# Patient Record
Sex: Male | Born: 1995 | Race: Black or African American | Hispanic: No | Marital: Single | Smoking: Never smoker
Health system: Southern US, Community
[De-identification: ages and names within clinical notes are randomized; demographics above are authoritative.]

## PROBLEM LIST (undated history)

## (undated) DIAGNOSIS — E119 Type 2 diabetes mellitus without complications: Secondary | ICD-10-CM

---

## 2016-10-19 DIAGNOSIS — E1110 Type 2 diabetes mellitus with ketoacidosis without coma: Secondary | ICD-10-CM | POA: Diagnosis present

## 2016-10-19 DIAGNOSIS — E119 Type 2 diabetes mellitus without complications: Secondary | ICD-10-CM | POA: Insufficient documentation

## 2017-01-19 DIAGNOSIS — I10 Essential (primary) hypertension: Secondary | ICD-10-CM | POA: Diagnosis present

## 2017-05-22 ENCOUNTER — Other Ambulatory Visit: Payer: Self-pay

## 2017-05-22 ENCOUNTER — Encounter (HOSPITAL_COMMUNITY): Payer: Self-pay | Admitting: Emergency Medicine

## 2017-05-22 ENCOUNTER — Emergency Department (HOSPITAL_COMMUNITY)
Admission: EM | Admit: 2017-05-22 | Discharge: 2017-05-23 | Disposition: A | Discharge status: Home / Self Care | Payer: Self-pay | Attending: Emergency Medicine | Admitting: Emergency Medicine

## 2017-05-22 ENCOUNTER — Emergency Department (HOSPITAL_COMMUNITY): Payer: Self-pay

## 2017-05-22 DIAGNOSIS — Y92410 Unspecified street and highway as the place of occurrence of the external cause: Secondary | ICD-10-CM | POA: Insufficient documentation

## 2017-05-22 DIAGNOSIS — E119 Type 2 diabetes mellitus without complications: Secondary | ICD-10-CM | POA: Insufficient documentation

## 2017-05-22 DIAGNOSIS — S43014A Anterior dislocation of right humerus, initial encounter: Secondary | ICD-10-CM | POA: Insufficient documentation

## 2017-05-22 DIAGNOSIS — Y999 Unspecified external cause status: Secondary | ICD-10-CM | POA: Insufficient documentation

## 2017-05-22 DIAGNOSIS — Y9389 Activity, other specified: Secondary | ICD-10-CM | POA: Insufficient documentation

## 2017-05-22 HISTORY — DX: Type 2 diabetes mellitus without complications: E11.9

## 2017-05-22 MED ORDER — HYDROMORPHONE HCL 1 MG/ML IJ SOLN
1.0000 mg | Freq: Once | INTRAMUSCULAR | Status: AC
  Administered 2017-05-22: 1 mg via INTRAVENOUS
  Filled 2017-05-22: qty 1

## 2017-05-22 MED ORDER — FENTANYL CITRATE (PF) 100 MCG/2ML IJ SOLN: 0.5000 ug/kg | Freq: Once | INTRAMUSCULAR | Status: DC

## 2017-05-22 MED ORDER — BUPIVACAINE HCL (PF) 0.5 % IJ SOLN
20.0000 mL | Freq: Once | INTRAMUSCULAR | Status: AC
  Administered 2017-05-22: 20 mL
  Filled 2017-05-22: qty 20

## 2017-05-22 MED ORDER — PROPOFOL 10 MG/ML IV BOLUS: 1.0000 mg/kg | Freq: Once | INTRAVENOUS | Status: DC
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
## 2017-05-22 NOTE — ED Notes (Signed)
Ortho paged. 

## 2017-05-22 NOTE — ED Notes (Signed)
Consent form for shoulder reduction signed

## 2017-05-23 ENCOUNTER — Emergency Department (HOSPITAL_COMMUNITY): Payer: Self-pay

## 2017-05-23 MED ORDER — CYCLOBENZAPRINE HCL 10 MG PO TABS: 10.0000 mg | ORAL_TABLET | Freq: Two times a day (BID) | ORAL | 0 refills | Status: DC | PRN

## 2017-05-23 MED ORDER — OXYCODONE-ACETAMINOPHEN 5-325 MG PO TABS: 1.0000 | ORAL_TABLET | ORAL | 0 refills | Status: DC | PRN
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
## 2017-05-23 NOTE — ED Notes (Signed)
Portable xray at bedside.

## 2017-05-23 NOTE — Progress Notes (Signed)
Orthopedic Tech Progress Note Patient Details:  Mark Contreras [birth year] [id number]  Ortho Devices Type of Ortho Device: Sling immobilizer Ortho Device/Splint Location: rue  Ortho Device/Splint Interventions: Ordered, Application   Post Interventions Patient Tolerated: Well Instructions Provided: Care of device, Adjustment of device Applied post reduction  [name], Cece Milhouse J 05/23/2017, 12:20 AM

## 2017-05-23 NOTE — ED Notes (Signed)
Shoulder reduction performed by Dr ward, ortho at bedside to apply shoulder immobilizer
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
## 2018-03-14 ENCOUNTER — Emergency Department (HOSPITAL_COMMUNITY)
Admission: EM | Admit: 2018-03-14 | Discharge: 2018-03-14 | Disposition: A | Discharge status: Home / Self Care | Payer: Medicaid Other | Attending: Emergency Medicine | Admitting: Emergency Medicine

## 2018-03-14 ENCOUNTER — Encounter (HOSPITAL_COMMUNITY): Payer: Self-pay

## 2018-03-14 DIAGNOSIS — Z79899 Other long term (current) drug therapy: Secondary | ICD-10-CM | POA: Insufficient documentation

## 2018-03-14 DIAGNOSIS — E119 Type 2 diabetes mellitus without complications: Secondary | ICD-10-CM | POA: Insufficient documentation

## 2018-03-14 DIAGNOSIS — J4521 Mild intermittent asthma with (acute) exacerbation: Secondary | ICD-10-CM | POA: Insufficient documentation

## 2018-03-14 MED ORDER — IPRATROPIUM-ALBUTEROL 0.5-2.5 (3) MG/3ML IN SOLN
3.0000 mL | Freq: Once | RESPIRATORY_TRACT | Status: AC
  Administered 2018-03-14: 3 mL via RESPIRATORY_TRACT
  Filled 2018-03-14: qty 3

## 2018-03-14 MED ORDER — ALBUTEROL SULFATE HFA 108 (90 BASE) MCG/ACT IN AERS: 1.0000 | INHALATION_SPRAY | Freq: Four times a day (QID) | RESPIRATORY_TRACT | 0 refills | Status: DC | PRN

## 2018-03-14 NOTE — Discharge Instructions (Addendum)
Please read attached information. If you experience any new or worsening signs or symptoms please return to the emergency room for evaluation. Please follow-up with your primary care provider or specialist as discussed. Please use medication prescribed only as directed and discontinue taking if you have any concerning signs or symptoms.   °

## 2018-03-14 NOTE — ED Provider Notes (Signed)
[facility] EMERGENCY DEPARTMENT Provider Note   CSN: [id number] Arrival date & time: 03/14/18  1209   History   Chief Complaint Chief Complaint  Patient presents with  . Cough    HPI Denzal Meir is a 22 y.o. male.  HPI  22 year old male presents today with complaints of asthma exacerbation.  Patient had symptoms started 2 days ago after being exposed to dust at work.  Patient notes wheeze that did not resolve with albuterol inhaler.  He denies any fever or productive cough, he notes some clear production with his cough with nasal congestion.  He notes this is typical of previous asthma exacerbations.  Past Medical History:  Diagnosis Date  . Diabetes mellitus without complication (HCC)     There are no active problems to display for this patient.   History reviewed. No pertinent surgical history.      Home Medications    Prior to Admission medications   Medication Sig Start Date End Date Taking? Authorizing Provider  albuterol (PROVENTIL HFA;VENTOLIN HFA) 108 (90 Base) MCG/ACT inhaler Inhale 1-2 puffs into the lungs every 6 (six) hours as needed for wheezing or shortness of breath. 03/14/18   Annarae Macnair, Tinnie Gens, PA-C  cyclobenzaprine (FLEXERIL) 10 MG tablet Take 1 tablet (10 mg total) by mouth 2 (two) times daily as needed for muscle spasms. 05/23/17   McDonald, Mia A, PA-C  oxyCODONE-acetaminophen (PERCOCET/ROXICET) 5-325 MG tablet Take 1 tablet by mouth every 4 (four) hours as needed for severe pain. 05/23/17   McDonald, Coral Else, PA-C    Family History History reviewed. No pertinent family history.  Social History Social History   Tobacco Use  . Smoking status: Never Smoker  . Smokeless tobacco: Never Used  Substance Use Topics  . Alcohol use: No    Frequency: Never  . Drug use: No     Allergies   Patient has no known allergies.   Review of Systems Review of Systems  All other systems reviewed and are negative.  Physical Exam Updated  Vital Signs BP (!) 144/97 (BP Location: Left Arm)   Pulse 84   Temp 98.4 F (36.9 C) (Oral)   Resp 16   Ht 5\' 7"  (1.702 m)   Wt 95.3 kg   SpO2 97%   BMI 32.89 kg/m   Physical Exam  Constitutional: He is oriented to person, place, and time. He appears well-developed and well-nourished.  HENT:  Head: Normocephalic and atraumatic.  Eyes: Pupils are equal, round, and reactive to light. Conjunctivae are normal. Right eye exhibits no discharge. Left eye exhibits no discharge. No scleral icterus.  Neck: Normal range of motion. No JVD present. No tracheal deviation present.  Pulmonary/Chest: Effort normal. No stridor.  Minor bilateral inspiratory and expiratory wheeze, no crackles no respiratory distress  Neurological: He is alert and oriented to person, place, and time. Coordination normal.  Psychiatric: He has a normal mood and affect. His behavior is normal. Judgment and thought content normal.  Nursing note and vitals reviewed.    ED Treatments / Results  Labs (all labs ordered are listed, but only abnormal results are displayed) Labs Reviewed - No data to display  EKG None  Radiology No results found.  Procedures Procedures (including critical care time)  Medications Ordered in ED Medications  ipratropium-albuterol (DUONEB) 0.5-2.5 (3) MG/3ML nebulizer solution 3 mL (3 mLs Nebulization Given 03/14/18 1228)     Initial Impression / Assessment and Plan / ED Course  I have reviewed the triage vital  signs and the nursing notes.  Pertinent labs & imaging results that were available during my care of the patient were reviewed by me and considered in my medical decision making (see chart for details).     Labs:   Imaging:  Consults:  Therapeutics: DuoNeb  Discharge Meds:   Assessment/Plan: 22 year old male presents today with likely asthma exacerbation.  This is minor, he will be given a breathing treatment and reassess with anticipated discharge home with  albuterol and return precautions.  Patient verbalized understanding and agreement to today's plan.   Final Clinical Impressions(s) / ED Diagnoses   Final diagnoses:  Mild intermittent asthma with exacerbation    ED Discharge Orders         Ordered    albuterol (PROVENTIL HFA;VENTOLIN HFA) 108 (90 Base) MCG/ACT inhaler  Every 6 hours PRN     03/14/18 1340           Eyvonne MechanicHedges, Eaton Folmar, PA-C 03/14/18 1341    Mancel BaleWentz, Elliott, MD 03/14/18 [phone]

## 2018-03-14 NOTE — ED Triage Notes (Signed)
Pt presents with 2 day h/o asthma exacerbation.  Pt reports dust at work that may have started cough, reports nasal congestion.
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
## 2021-02-13 ENCOUNTER — Other Ambulatory Visit: Payer: Self-pay

## 2021-02-13 ENCOUNTER — Encounter (HOSPITAL_COMMUNITY): Payer: Self-pay | Admitting: Emergency Medicine

## 2021-02-13 ENCOUNTER — Inpatient Hospital Stay (HOSPITAL_COMMUNITY)
Admission: EM | Admit: 2021-02-13 | Discharge: 2021-02-15 | DRG: 638 | Disposition: A | Discharge status: Home / Self Care | Payer: BC Managed Care – PPO | Attending: Student | Admitting: Student

## 2021-02-13 DIAGNOSIS — J4540 Moderate persistent asthma, uncomplicated: Secondary | ICD-10-CM | POA: Diagnosis present

## 2021-02-13 DIAGNOSIS — T383X6A Underdosing of insulin and oral hypoglycemic [antidiabetic] drugs, initial encounter: Secondary | ICD-10-CM | POA: Diagnosis present

## 2021-02-13 DIAGNOSIS — F1210 Cannabis abuse, uncomplicated: Secondary | ICD-10-CM | POA: Diagnosis present

## 2021-02-13 DIAGNOSIS — E875 Hyperkalemia: Secondary | ICD-10-CM | POA: Diagnosis present

## 2021-02-13 DIAGNOSIS — F1010 Alcohol abuse, uncomplicated: Secondary | ICD-10-CM | POA: Diagnosis not present

## 2021-02-13 DIAGNOSIS — R000 Tachycardia, unspecified: Secondary | ICD-10-CM

## 2021-02-13 DIAGNOSIS — E1010 Type 1 diabetes mellitus with ketoacidosis without coma: Secondary | ICD-10-CM | POA: Diagnosis not present

## 2021-02-13 DIAGNOSIS — E1110 Type 2 diabetes mellitus with ketoacidosis without coma: Secondary | ICD-10-CM

## 2021-02-13 DIAGNOSIS — Z20822 Contact with and (suspected) exposure to covid-19: Secondary | ICD-10-CM | POA: Diagnosis present

## 2021-02-13 DIAGNOSIS — F1290 Cannabis use, unspecified, uncomplicated: Secondary | ICD-10-CM | POA: Diagnosis present

## 2021-02-13 DIAGNOSIS — E1310 Other specified diabetes mellitus with ketoacidosis without coma: Secondary | ICD-10-CM

## 2021-02-13 DIAGNOSIS — Z79899 Other long term (current) drug therapy: Secondary | ICD-10-CM

## 2021-02-13 DIAGNOSIS — Z7141 Alcohol abuse counseling and surveillance of alcoholic: Secondary | ICD-10-CM

## 2021-02-13 DIAGNOSIS — E860 Dehydration: Secondary | ICD-10-CM | POA: Diagnosis present

## 2021-02-13 DIAGNOSIS — Z91199 Patient's noncompliance with other medical treatment and regimen due to unspecified reason: Secondary | ICD-10-CM

## 2021-02-13 DIAGNOSIS — E1165 Type 2 diabetes mellitus with hyperglycemia: Secondary | ICD-10-CM

## 2021-02-13 DIAGNOSIS — Z7151 Drug abuse counseling and surveillance of drug abuser: Secondary | ICD-10-CM

## 2021-02-13 DIAGNOSIS — N179 Acute kidney failure, unspecified: Secondary | ICD-10-CM | POA: Diagnosis not present

## 2021-02-13 DIAGNOSIS — E871 Hypo-osmolality and hyponatremia: Secondary | ICD-10-CM | POA: Diagnosis present

## 2021-02-13 DIAGNOSIS — E876 Hypokalemia: Secondary | ICD-10-CM | POA: Diagnosis not present

## 2021-02-13 DIAGNOSIS — E669 Obesity, unspecified: Secondary | ICD-10-CM | POA: Diagnosis present

## 2021-02-13 DIAGNOSIS — Z794 Long term (current) use of insulin: Secondary | ICD-10-CM

## 2021-02-13 DIAGNOSIS — Z9114 Patient's other noncompliance with medication regimen: Secondary | ICD-10-CM

## 2021-02-13 DIAGNOSIS — J4520 Mild intermittent asthma, uncomplicated: Secondary | ICD-10-CM | POA: Diagnosis present

## 2021-02-13 DIAGNOSIS — I10 Essential (primary) hypertension: Secondary | ICD-10-CM | POA: Diagnosis present

## 2021-02-13 LAB — I-STAT VENOUS BLOOD GAS, ED
Acid-base deficit: 15 mmol/L — ABNORMAL HIGH (ref 0.0–2.0)
Bicarbonate: 11.3 mmol/L — ABNORMAL LOW (ref 20.0–28.0)
Calcium, Ion: 1.2 mmol/L (ref 1.15–1.40)
HCT: 51 % (ref 39.0–52.0)
Hemoglobin: 17.3 g/dL — ABNORMAL HIGH (ref 13.0–17.0)
O2 Saturation: 60 %
Potassium: 6.1 mmol/L — ABNORMAL HIGH (ref 3.5–5.1)
Sodium: 135 mmol/L (ref 135–145)
TCO2: 12 mmol/L — ABNORMAL LOW (ref 22–32)
pCO2, Ven: 27.4 mmHg — ABNORMAL LOW (ref 44.0–60.0)
pH, Ven: 7.223 — ABNORMAL LOW (ref 7.250–7.430)
pO2, Ven: 37 mmHg (ref 32.0–45.0)

## 2021-02-13 LAB — BASIC METABOLIC PANEL
Anion gap: 28 — ABNORMAL HIGH (ref 5–15)
Anion gap: 24 — ABNORMAL HIGH (ref 5–15)
BUN: 28 mg/dL — ABNORMAL HIGH (ref 6–20)
BUN: 24 mg/dL — ABNORMAL HIGH (ref 6–20)
CO2: 12 mmol/L — ABNORMAL LOW (ref 22–32)
CO2: 12 mmol/L — ABNORMAL LOW (ref 22–32)
Calcium: 10.6 mg/dL — ABNORMAL HIGH (ref 8.9–10.3)
Calcium: 10.4 mg/dL — ABNORMAL HIGH (ref 8.9–10.3)
Chloride: 94 mmol/L — ABNORMAL LOW (ref 98–111)
Chloride: 104 mmol/L (ref 98–111)
Creatinine, Ser: 2.19 mg/dL — ABNORMAL HIGH (ref 0.61–1.24)
Creatinine, Ser: 2.04 mg/dL — ABNORMAL HIGH (ref 0.61–1.24)
GFR, Estimated: 42 mL/min — ABNORMAL LOW (ref >60)
GFR, Estimated: 45 mL/min — ABNORMAL LOW (ref >60)
Glucose, Bld: 666 mg/dL (ref 70–99)
Glucose, Bld: 250 mg/dL — ABNORMAL HIGH (ref 70–99)
Potassium: 7.4 mmol/L (ref 3.5–5.1)
Potassium: 4 mmol/L (ref 3.5–5.1)
Sodium: 134 mmol/L — ABNORMAL LOW (ref 135–145)
Sodium: 140 mmol/L (ref 135–145)

## 2021-02-13 LAB — URINALYSIS, ROUTINE W REFLEX MICROSCOPIC
Bacteria, UA: NONE SEEN
Bilirubin Urine: NEGATIVE
Glucose, UA: >=500 mg/dL — AB
Ketones, ur: 80 mg/dL — AB
Leukocytes,Ua: NEGATIVE
Nitrite: NEGATIVE
Protein, ur: 100 mg/dL — AB
Specific Gravity, Urine: 1.027 (ref 1.005–1.030)
pH: 5 (ref 5.0–8.0)

## 2021-02-13 LAB — CBC
HCT: 50 % (ref 39.0–52.0)
Hemoglobin: 16 g/dL (ref 13.0–17.0)
MCH: 26.1 pg (ref 26.0–34.0)
MCHC: 32 g/dL (ref 30.0–36.0)
MCV: 81.7 fL (ref 80.0–100.0)
Platelets: 217 10*3/uL (ref 150–400)
RBC: 6.12 MIL/uL — ABNORMAL HIGH (ref 4.22–5.81)
RDW: 12.6 % (ref 11.5–15.5)
WBC: 9 10*3/uL (ref 4.0–10.5)
nRBC: 0 % (ref 0.0–0.2)

## 2021-02-13 LAB — CBG MONITORING, ED
Glucose-Capillary: >600 mg/dL (ref 70–99)
Glucose-Capillary: 532 mg/dL (ref 70–99)
Glucose-Capillary: 294 mg/dL — ABNORMAL HIGH (ref 70–99)
Glucose-Capillary: 254 mg/dL — ABNORMAL HIGH (ref 70–99)
Glucose-Capillary: 239 mg/dL — ABNORMAL HIGH (ref 70–99)
Glucose-Capillary: 241 mg/dL — ABNORMAL HIGH (ref 70–99)
Glucose-Capillary: 247 mg/dL — ABNORMAL HIGH (ref 70–99)

## 2021-02-13 LAB — RAPID URINE DRUG SCREEN, HOSP PERFORMED
Amphetamines: NOT DETECTED
Barbiturates: NOT DETECTED
Benzodiazepines: NOT DETECTED
Cocaine: NOT DETECTED
Opiates: NOT DETECTED
Tetrahydrocannabinol: POSITIVE — AB

## 2021-02-13 LAB — RESP PANEL BY RT-PCR (FLU A&B, COVID) ARPGX2
Influenza A by PCR: NEGATIVE
Influenza B by PCR: NEGATIVE
SARS Coronavirus 2 by RT PCR: NEGATIVE

## 2021-02-13 LAB — BETA-HYDROXYBUTYRIC ACID
Beta-Hydroxybutyric Acid: >8 mmol/L — ABNORMAL HIGH (ref 0.05–0.27)
Beta-Hydroxybutyric Acid: >8 mmol/L — ABNORMAL HIGH (ref 0.05–0.27)

## 2021-02-13 LAB — HIV ANTIBODY (ROUTINE TESTING W REFLEX): HIV Screen 4th Generation wRfx: NONREACTIVE

## 2021-02-13 LAB — MAGNESIUM: Magnesium: 2.9 mg/dL — ABNORMAL HIGH (ref 1.7–2.4)

## 2021-02-13 LAB — PHOSPHORUS: Phosphorus: 5 mg/dL — ABNORMAL HIGH (ref 2.5–4.6)

## 2021-02-13 LAB — HEMOGLOBIN A1C
Hgb A1c MFr Bld: 10.9 % — ABNORMAL HIGH (ref 4.8–5.6)
Mean Plasma Glucose: 266.13 mg/dL

## 2021-02-13 MED ORDER — LACTATED RINGERS IV SOLN: INTRAVENOUS | Status: DC

## 2021-02-13 MED ORDER — LORAZEPAM 2 MG/ML IJ SOLN: 1.0000 mg | INTRAMUSCULAR | Status: DC | PRN

## 2021-02-13 MED ORDER — DEXTROSE 50 % IV SOLN: 0.0000 mL | INTRAVENOUS | Status: DC | PRN

## 2021-02-13 MED ORDER — FOLIC ACID 1 MG PO TABS
1.0000 mg | ORAL_TABLET | Freq: Every day | ORAL | Status: DC
  Administered 2021-02-13 – 2021-02-15 (×3): 1 mg via ORAL
  Filled 2021-02-13 (×3): qty 1

## 2021-02-13 MED ORDER — INSULIN REGULAR(HUMAN) IN NACL 100-0.9 UT/100ML-% IV SOLN
INTRAVENOUS | Status: DC
  Administered 2021-02-13: 13 [IU]/h via INTRAVENOUS
  Filled 2021-02-13: qty 100

## 2021-02-13 MED ORDER — INSULIN REGULAR(HUMAN) IN NACL 100-0.9 UT/100ML-% IV SOLN
INTRAVENOUS | Status: DC
  Administered 2021-02-13: 7.5 [IU]/h via INTRAVENOUS
  Administered 2021-02-14: 7 [IU]/h via INTRAVENOUS
  Administered 2021-02-14: 4.8 [IU]/h via INTRAVENOUS
  Administered 2021-02-14: 2.4 [IU]/h via INTRAVENOUS
  Administered 2021-02-14: 6 [IU]/h via INTRAVENOUS
  Administered 2021-02-14: 7 [IU]/h via INTRAVENOUS
  Administered 2021-02-14: 0.5 [IU]/h via INTRAVENOUS
  Filled 2021-02-13: qty 100

## 2021-02-13 MED ORDER — ALBUTEROL SULFATE (2.5 MG/3ML) 0.083% IN NEBU: 3.0000 mL | INHALATION_SOLUTION | RESPIRATORY_TRACT | Status: DC | PRN

## 2021-02-13 MED ORDER — LACTATED RINGERS IV BOLUS
1000.0000 mL | Freq: Once | INTRAVENOUS | Status: AC
  Administered 2021-02-13: 1000 mL via INTRAVENOUS

## 2021-02-13 MED ORDER — DEXTROSE IN LACTATED RINGERS 5 % IV SOLN: INTRAVENOUS | Status: DC

## 2021-02-13 MED ORDER — ADULT MULTIVITAMIN W/MINERALS CH
1.0000 | ORAL_TABLET | Freq: Every day | ORAL | Status: DC
  Administered 2021-02-13 – 2021-02-15 (×3): 1 via ORAL
  Filled 2021-02-13 (×3): qty 1

## 2021-02-13 MED ORDER — THIAMINE HCL 100 MG PO TABS
100.0000 mg | ORAL_TABLET | Freq: Every day | ORAL | Status: DC
  Administered 2021-02-13 – 2021-02-15 (×3): 100 mg via ORAL
  Filled 2021-02-13 (×3): qty 1

## 2021-02-13 MED ORDER — LORAZEPAM 1 MG PO TABS: 1.0000 mg | ORAL_TABLET | ORAL | Status: DC | PRN

## 2021-02-13 MED ORDER — LABETALOL HCL 5 MG/ML IV SOLN
10.0000 mg | INTRAVENOUS | Status: DC | PRN
  Administered 2021-02-13: 10 mg via INTRAVENOUS
  Filled 2021-02-13: qty 4

## 2021-02-13 MED ORDER — ENOXAPARIN SODIUM 60 MG/0.6ML IJ SOSY
50.0000 mg | PREFILLED_SYRINGE | INTRAMUSCULAR | Status: DC
  Administered 2021-02-13 – 2021-02-14 (×2): 50 mg via SUBCUTANEOUS
  Filled 2021-02-13: qty 0.5
  Filled 2021-02-13: qty 0.6

## 2021-02-13 MED ORDER — THIAMINE HCL 100 MG/ML IJ SOLN
100.0000 mg | Freq: Every day | INTRAMUSCULAR | Status: DC
  Filled 2021-02-13: qty 2

## 2021-02-13 MED ORDER — INSULIN ASPART 100 UNIT/ML IV SOLN
10.0000 [IU] | Freq: Once | INTRAVENOUS | Status: AC
  Administered 2021-02-13: 10 [IU] via INTRAVENOUS

## 2021-02-13 NOTE — H&P (Signed)
History and Physical    Aemon Koeller SUP:[id number] DOB: 1995-04-23 DOA: 02/13/2021  PCP: Pcp, No Patient coming from: Home  Chief Complaint: Frequent urination and weakness  HPI: Malakhai Beitler is a 25 y.o. male with history of IDDM, HTN, asthma, marijuana use and alcohol use disorder presenting with frequent urination for 3 days and generalized weakness for about 2 weeks.  Reportedly had runny nose about 2 weeks ago that has resolved.  He had generalized weakness for 2 weeks that he has been attributing to stress related to his work.  He works with [employer].  He he says he stopped his insulin and metformin about 3 years ago when his diabetes was under control although he was supposed to be on metformin as of March 2022 per care everywhere.  Not on medication for hypertension or asthma either.  He said he had his A1c checked earlier this year and was told that he does not need medication for his diabetes.  On review of his chart under care everywhere, his A1c was 6.1% on 06/10/2020.  [facility] for elevated blood pressure.  He denies fever, chills, polydipsia, dry mouth, chest pain, shortness of breath, cough, nausea, vomiting, abdominal pain or focal neurodeficit.  She reports frequent urination and associated dysuria.  He denies new back pain or suprapubic pain.   He reports smoking marijuana almost every day.  Drinks tequila.  Last drink was yesterday.    In ED, BP 145/105>> 178/107.  HR 127>> 112.  Afebrile.  Normal saturation.  VBG 7.2 06/15/35/11.  Na 134> 135. K 7.4>> 6.1. Cl 94.  Bicarb 12.  AG 29.  Glucose 666. Ca 10.6.  Cr 2.19.  BUN 28.  BHA > 8.0. UA > 500 glucose, small Hgb, 80 ketones and high troponin.  Twelve-lead EKG normal sinus rhythm.  Received 10 units of IV insulin and LR bolus.  Started on IV fluid and insulin drip, and hospitalist service consulted for admission for DKA.  ROS All review of system negative except for pertinent positives and negatives  as history of present illness above.  PMH Past Medical History:  Diagnosis Date   Diabetes mellitus without complication (HCC)    PSH History reviewed. No pertinent surgical history.  Fam HX Denies family history of heart disease  Social Hx See HPI  Allergy No Known Allergies to medications  Home Meds Prior to Admission medications   Medication Sig Start Date End Date Taking? Authorizing Provider  albuterol (PROVENTIL HFA;VENTOLIN HFA) 108 (90 Base) MCG/ACT inhaler Inhale 1-2 puffs into the lungs every 6 (six) hours as needed for wheezing or shortness of breath. 03/14/18   Hedges, Tinnie Gens, PA-C  cyclobenzaprine (FLEXERIL) 10 MG tablet Take 1 tablet (10 mg total) by mouth 2 (two) times daily as needed for muscle spasms. 05/23/17   McDonald, Mia A, PA-C  oxyCODONE-acetaminophen (PERCOCET/ROXICET) 5-325 MG tablet Take 1 tablet by mouth every 4 (four) hours as needed for severe pain. 05/23/17   Barkley Boards, PA-C    Physical Exam: Vitals:   02/13/21 1415 02/13/21 1445 02/13/21 1500 02/13/21 1515  BP: (!) 158/108 (!) 162/105 (!) 161/108 (!) 178/107  Pulse:  (!) 120    Resp: 17 (!) 21 16 18   Temp:      TempSrc:      SpO2:  98%    Weight:      Height:        GENERAL: No acute distress.  Appears well.  HEENT: MMM.  Vision and hearing grossly intact.  NECK: Supple.  No apparent JVD.  RESP:  No IWOB. Good air movement bilaterally. CVS: Tachycardic to 110s. Heart sounds normal.  ABD/GI/GU: Bowel sounds present. Soft. Non tender.  MSK/EXT:  Moves extremities. No apparent deformity or edema.  SKIN: no apparent skin lesion or wound NEURO: Awake, alert and oriented appropriately.  No gross deficit.  PSYCH: Calm. Normal affect.    Personally Reviewed Radiological Exams No results found.   Personally Reviewed Labs: CBC: Recent Labs  Lab 02/13/21 1111 02/13/21 1432  WBC 9.0  --   HGB 16.0 17.3*  HCT 50.0 51.0  MCV 81.7  --   PLT 217  --    Basic Metabolic  Panel: Recent Labs  Lab 02/13/21 1111 02/13/21 1432  NA 134* 135  K 7.4* 6.1*  CL 94*  --   CO2 12*  --   GLUCOSE 666*  --   BUN 28*  --   CREATININE 2.19*  --   CALCIUM 10.6*  --    GFR: Estimated Creatinine Clearance: 56.2 mL/min (A) (by C-G formula based on SCr of 2.19 mg/dL (H)). Liver Function Tests: No results for input(s): AST, ALT, ALKPHOS, BILITOT, PROT, ALBUMIN in the last 168 hours. No results for input(s): LIPASE, AMYLASE in the last 168 hours. No results for input(s): AMMONIA in the last 168 hours. Coagulation Profile: No results for input(s): INR, PROTIME in the last 168 hours. Cardiac Enzymes: No results for input(s): CKTOTAL, CKMB, CKMBINDEX, TROPONINI in the last 168 hours. BNP (last 3 results) No results for input(s): PROBNP in the last 8760 hours. HbA1C: No results for input(s): HGBA1C in the last 72 hours. CBG: Recent Labs  Lab 02/13/21 1101 02/13/21 1541  GLUCAP >600* 532*   Lipid Profile: No results for input(s): CHOL, HDL, LDLCALC, TRIG, CHOLHDL, LDLDIRECT in the last 72 hours. Thyroid Function Tests: No results for input(s): TSH, T4TOTAL, FREET4, T3FREE, THYROIDAB in the last 72 hours. Anemia Panel: No results for input(s): VITAMINB12, FOLATE, FERRITIN, TIBC, IRON, RETICCTPCT in the last 72 hours. Urine analysis:    Component Value Date/Time   COLORURINE COLORLESS (A) 02/13/2021 1106   APPEARANCEUR CLEAR 02/13/2021 1106   LABSPEC 1.027 02/13/2021 1106   PHURINE 5.0 02/13/2021 1106   GLUCOSEU >=500 (A) 02/13/2021 1106   HGBUR SMALL (A) 02/13/2021 1106   BILIRUBINUR NEGATIVE 02/13/2021 1106   KETONESUR 80 (A) 02/13/2021 1106   PROTEINUR 100 (A) 02/13/2021 1106   NITRITE NEGATIVE 02/13/2021 1106   LEUKOCYTESUR NEGATIVE 02/13/2021 1106    Sepsis Labs:  None  Personally Reviewed EKG:  Twelve-lead EKG with sinus tachycardia  Assessment/Plan Uncontrolled DM-2 with ketoacidosis/hyperglycemia: Likely due to noncompliance with medication  and alcohol abuse.  Glucose 666.  pH 7.2.  Bicarb 12.  AG 28.  BHA > 8.0.  K7.4>> 6.1.  No mental status change.  No signs of infection or ACS.  Dysuria and frequent urination likely from hyperglycemia. -DKA pathway with IV fluid, insulin drip and frequent labs -Check hemoglobin A1c -Consult diabetic coordinator -Okay with sips and ice chips  AKI/azotemia-likely prerenal in the setting of DKA. -IV fluid as above -Continue monitoring  Hyperkalemia: 7.4 but hemolyzed.  Repeat K6.1.  No EKG changes. -Continue IV fluid -Check with every 4 BMP  Hypercalcemia: Likely in the setting of #1. -IV fluids  Sinus tachycardia/uncontrolled hypertension: Supposed to be on lisinopril but not compliant. -As needed labetalol -Hold home lisinopril in the setting of AKI  Alcohol abuse:  Admits to drinking tequila.  Had 3 shots yesterday. -counseled -TOC consulted -CIWA with as needed Ativan -Multivitamin/thiamine and folic acid  Mild intermittent asthma: Not on medication. -As needed albuterol  Hyponatremia: Likely due to hyperglycemia.  Resolved.  DVT prophylaxis: Subcu Lovenox  Code Status: Full code Family Communication: Updated patient's significant other at bedside  Disposition Plan: Admit to progressive care Consults called: None Admission status: Observation  Level of care: Progressive   Almon Hercules MD [facility]  If 7PM-7AM, please contact night-coverage [email or web address]  02/13/2021, 4:30 PM

## 2021-02-13 NOTE — ED Provider Notes (Signed)
Emergency Medicine Provider Triage Evaluation Note  Mark Contreras , a 25 y.o. male  was evaluated in triage.  Pt complains of dehydration, polyuria, nausea, general malaise. Patient has been diagnosed with diabetes in the past, was taking metformin, currently not taking anything. Patient does not check blood sugar at home. No history of insulin use, DKA. No chest pain, SOB.  Review of Systems  Positive: As above Negative: As above  Physical Exam  BP (!) 145/105 (BP Location: Left Arm)   Pulse (!) 127   Temp 97.7 F (36.5 C) (Oral)   Resp 18   SpO2 99%  Gen:   Awake, ill appearing Resp:  Normal effort, somewhat shallow MSK:   Moves extremities without difficulty  Other:  Tachycardia, some TTP abdomen  Medical Decision Making  Medically screening exam initiated at 11:08 AM.  Appropriate orders placed.  Mark Contreras was informed that the remainder of the evaluation will be completed by another provider, this initial triage assessment does not replace that evaluation, and the importance of remaining in the ED until their evaluation is complete.  Hyperglycemic crisis, charge nurse informed patient needs room   [place] 02/13/21 1110    Tegeler, Canary Brim, MD 02/13/21 1131

## 2021-02-13 NOTE — ED Provider Notes (Signed)
[facility] EMERGENCY DEPARTMENT Provider Note   CSN: [id number] Arrival date & time: 02/13/21  1025     History Chief Complaint  Patient presents with   Hyperglycemia    Mark Contreras is a 25 y.o. male with PMH type 1 diabetes who presents the emergency department for evaluation of dehydration, polyuria and generalized malaise.  Patient states that in the past he was taking metformin but he is currently not taking any medications for his insulin.  He currently denies chest pain, shortness of breath, abdominal pain, fever, headache or other systemic symptoms.  Patient arrives tachycardic but overall very well-appearing.   Hyperglycemia Associated symptoms: fatigue, increased thirst and polyuria   Associated symptoms: no abdominal pain, no chest pain, no dysuria, no fever, no shortness of breath and no vomiting       Past Medical History:  Diagnosis Date   Diabetes mellitus without complication [facility])     Patient Active Problem List   Diagnosis Date Noted   DKA (diabetic ketoacidosis) (HCC) 02/13/2021    History reviewed. No pertinent surgical history.     No family history on file.  Social History   Tobacco Use   Smoking status: Never   Smokeless tobacco: Never  Substance Use Topics   Alcohol use: No   Drug use: No    Home Medications Prior to Admission medications   Medication Sig Start Date End Date Taking? Authorizing Provider  albuterol (PROVENTIL HFA;VENTOLIN HFA) 108 (90 Base) MCG/ACT inhaler Inhale 1-2 puffs into the lungs every 6 (six) hours as needed for wheezing or shortness of breath. 03/14/18   Hedges, Tinnie Gens, PA-C  cyclobenzaprine (FLEXERIL) 10 MG tablet Take 1 tablet (10 mg total) by mouth 2 (two) times daily as needed for muscle spasms. 05/23/17   McDonald, Mia A, PA-C  oxyCODONE-acetaminophen (PERCOCET/ROXICET) 5-325 MG tablet Take 1 tablet by mouth every 4 (four) hours as needed for severe pain. 05/23/17   McDonald, Mia A, PA-C     Allergies    Patient has no known allergies.  Review of Systems   Review of Systems  Constitutional:  Positive for fatigue. Negative for chills and fever.  HENT:  Negative for ear pain and sore throat.   Eyes:  Negative for pain and visual disturbance.  Respiratory:  Negative for cough and shortness of breath.   Cardiovascular:  Negative for chest pain and palpitations.  Gastrointestinal:  Negative for abdominal pain and vomiting.  Endocrine: Positive for polydipsia and polyuria.  Genitourinary:  Negative for dysuria and hematuria.  Musculoskeletal:  Negative for arthralgias and back pain.  Skin:  Negative for color change and rash.  Neurological:  Negative for seizures and syncope.  All other systems reviewed and are negative.  Physical Exam Updated Vital Signs BP (!) 178/107   Pulse (!) 120   Temp 97.7 F (36.5 C) (Oral)   Resp 18   Ht 5\' 7"  (1.702 m)   Wt 95.3 kg   SpO2 98%   BMI 32.89 kg/m   Physical Exam Vitals and nursing note reviewed.  Constitutional:      Appearance: He is well-developed.  HENT:     Head: Normocephalic and atraumatic.  Eyes:     Conjunctiva/sclera: Conjunctivae normal.  Cardiovascular:     Rate and Rhythm: Regular rhythm. Tachycardia present.     Heart sounds: No murmur heard. Pulmonary:     Effort: Pulmonary effort is normal. No respiratory distress.     Breath sounds: Normal breath sounds.  Abdominal:     Palpations: Abdomen is soft.     Tenderness: There is no abdominal tenderness.  Musculoskeletal:     Cervical back: Neck supple.  Skin:    General: Skin is warm and dry.  Neurological:     Mental Status: He is alert.    ED Results / Procedures / Treatments   Labs (all labs ordered are listed, but only abnormal results are displayed) Labs Reviewed  BASIC METABOLIC PANEL - Abnormal; Notable for the following components:      Result Value   Sodium 134 (*)    Potassium 7.4 (*)    Chloride 94 (*)    CO2 12 (*)    Glucose,  Bld 666 (*)    BUN 28 (*)    Creatinine, Ser 2.19 (*)    Calcium 10.6 (*)    GFR, Estimated 42 (*)    Anion gap 28 (*)    All other components within normal limits  CBC - Abnormal; Notable for the following components:   RBC 6.12 (*)    All other components within normal limits  URINALYSIS, ROUTINE W REFLEX MICROSCOPIC - Abnormal; Notable for the following components:   Color, Urine COLORLESS (*)    Glucose, UA >=500 (*)    Hgb urine dipstick SMALL (*)    Ketones, ur 80 (*)    Protein, ur 100 (*)    All other components within normal limits  BETA-HYDROXYBUTYRIC ACID - Abnormal; Notable for the following components:   Beta-Hydroxybutyric Acid >8.00 (*)    All other components within normal limits  CBG MONITORING, ED - Abnormal; Notable for the following components:   Glucose-Capillary >600 (*)    All other components within normal limits  CBG MONITORING, ED - Abnormal; Notable for the following components:   Glucose-Capillary 532 (*)    All other components within normal limits  I-STAT VENOUS BLOOD GAS, ED - Abnormal; Notable for the following components:   pH, Ven 7.223 (*)    pCO2, Ven 27.4 (*)    Bicarbonate 11.3 (*)    TCO2 12 (*)    Acid-base deficit 15.0 (*)    Potassium 6.1 (*)    Hemoglobin 17.3 (*)    All other components within normal limits  BETA-HYDROXYBUTYRIC ACID  BETA-HYDROXYBUTYRIC ACID  CBG MONITORING, ED    EKG None  Radiology No results found.  Procedures .Critical Care Performed by: Glendora Score, MD Authorized by: Glendora Score, MD   Critical care provider statement:    Critical care time (minutes):  50   Critical care was necessary to treat or prevent imminent or life-threatening deterioration of the following conditions:  Metabolic crisis   Critical care was time spent personally by me on the following activities:  Blood draw for specimens, ordering and performing treatments and interventions, ordering and review of laboratory studies,  ordering and review of radiographic studies, pulse oximetry, review of old charts and examination of patient   Medications Ordered in ED Medications  insulin regular, human (MYXREDLIN) 100 units/ 100 mL infusion (13 Units/hr Intravenous New Bag/Given 02/13/21 1615)  lactated ringers infusion ( Intravenous New Bag/Given 02/13/21 1618)  dextrose 5 % in lactated ringers infusion (0 mLs Intravenous Hold 02/13/21 1517)  dextrose 50 % solution 0-50 mL (has no administration in time range)  labetalol (NORMODYNE) injection 10 mg (has no administration in time range)  lactated ringers bolus 1,000 mL (1,000 mLs Intravenous New Bag/Given 02/13/21 1455)  insulin aspart (novoLOG) injection 10 Units (10  Units Intravenous Given 02/13/21 1605)    ED Course  I have reviewed the triage vital signs and the nursing notes.  Pertinent labs & imaging results that were available during my care of the patient were reviewed by me and considered in my medical decision making (see chart for details).    MDM Rules/Calculators/A&P                           Patient presents emergency department for evaluation of generalized malaise hyperglycemia, polyuria polydipsia.  Physical exam is large unremarkable.  Laboratory evaluation concerning with a pH of 7.2 potassium 7.4, CO2 12, glucose 666, creatinine 2.19 with a BUN of 28, beta hydroxybutyrate greater than 8, UA with ketonuria.  ECG with no QRS elongation or concerning findings in the setting of his hyperkalemia.  Patient started on an insulin drip and bolus 10 units for his hyperkalemia.  Fluid resuscitation began and patient admitted to stepdown unit for diabetic ketoacidosis. Final Clinical Impression(s) / ED Diagnoses Final diagnoses:  None    Rx / DC Orders ED Discharge Orders     None        Mohogany Toppins, Wyn Forster, MD 02/13/21 1624

## 2021-02-13 NOTE — ED Triage Notes (Signed)
Pt reports cotton mouth, frequent urination, and fatigue.  States he was diagnosed with diabetes in the past but never started on medication because his last blood work came back ok.

## 2021-02-14 DIAGNOSIS — Z79899 Other long term (current) drug therapy: Secondary | ICD-10-CM | POA: Diagnosis not present

## 2021-02-14 DIAGNOSIS — Z6832 Body mass index (BMI) 32.0-32.9, adult: Secondary | ICD-10-CM

## 2021-02-14 DIAGNOSIS — E1310 Other specified diabetes mellitus with ketoacidosis without coma: Secondary | ICD-10-CM | POA: Diagnosis not present

## 2021-02-14 DIAGNOSIS — E1010 Type 1 diabetes mellitus with ketoacidosis without coma: Secondary | ICD-10-CM | POA: Diagnosis present

## 2021-02-14 DIAGNOSIS — E669 Obesity, unspecified: Secondary | ICD-10-CM | POA: Diagnosis present

## 2021-02-14 DIAGNOSIS — Z91199 Patient's noncompliance with other medical treatment and regimen due to unspecified reason: Secondary | ICD-10-CM | POA: Diagnosis not present

## 2021-02-14 DIAGNOSIS — Z7141 Alcohol abuse counseling and surveillance of alcoholic: Secondary | ICD-10-CM | POA: Diagnosis not present

## 2021-02-14 DIAGNOSIS — T383X6A Underdosing of insulin and oral hypoglycemic [antidiabetic] drugs, initial encounter: Secondary | ICD-10-CM | POA: Diagnosis present

## 2021-02-14 DIAGNOSIS — E6609 Other obesity due to excess calories: Secondary | ICD-10-CM

## 2021-02-14 DIAGNOSIS — E860 Dehydration: Secondary | ICD-10-CM | POA: Diagnosis present

## 2021-02-14 DIAGNOSIS — N179 Acute kidney failure, unspecified: Secondary | ICD-10-CM | POA: Diagnosis present

## 2021-02-14 DIAGNOSIS — E871 Hypo-osmolality and hyponatremia: Secondary | ICD-10-CM | POA: Diagnosis present

## 2021-02-14 DIAGNOSIS — J4530 Mild persistent asthma, uncomplicated: Secondary | ICD-10-CM

## 2021-02-14 DIAGNOSIS — F1210 Cannabis abuse, uncomplicated: Secondary | ICD-10-CM | POA: Diagnosis not present

## 2021-02-14 DIAGNOSIS — E1110 Type 2 diabetes mellitus with ketoacidosis without coma: Secondary | ICD-10-CM | POA: Diagnosis present

## 2021-02-14 DIAGNOSIS — Z9114 Patient's other noncompliance with medication regimen: Secondary | ICD-10-CM | POA: Diagnosis not present

## 2021-02-14 DIAGNOSIS — F1010 Alcohol abuse, uncomplicated: Secondary | ICD-10-CM | POA: Diagnosis present

## 2021-02-14 DIAGNOSIS — F1290 Cannabis use, unspecified, uncomplicated: Secondary | ICD-10-CM | POA: Diagnosis present

## 2021-02-14 DIAGNOSIS — Z794 Long term (current) use of insulin: Secondary | ICD-10-CM | POA: Diagnosis not present

## 2021-02-14 DIAGNOSIS — E876 Hypokalemia: Secondary | ICD-10-CM | POA: Diagnosis not present

## 2021-02-14 DIAGNOSIS — Z20822 Contact with and (suspected) exposure to covid-19: Secondary | ICD-10-CM | POA: Diagnosis present

## 2021-02-14 DIAGNOSIS — J4520 Mild intermittent asthma, uncomplicated: Secondary | ICD-10-CM | POA: Diagnosis present

## 2021-02-14 DIAGNOSIS — E875 Hyperkalemia: Secondary | ICD-10-CM | POA: Diagnosis present

## 2021-02-14 DIAGNOSIS — I10 Essential (primary) hypertension: Secondary | ICD-10-CM | POA: Diagnosis present

## 2021-02-14 DIAGNOSIS — Z7151 Drug abuse counseling and surveillance of drug abuser: Secondary | ICD-10-CM | POA: Diagnosis not present

## 2021-02-14 LAB — RENAL FUNCTION PANEL
Albumin: 3.7 g/dL (ref 3.5–5.0)
Albumin: 3.3 g/dL — ABNORMAL LOW (ref 3.5–5.0)
Albumin: 3.4 g/dL — ABNORMAL LOW (ref 3.5–5.0)
Anion gap: 11 (ref 5–15)
Anion gap: 9 (ref 5–15)
Anion gap: 11 (ref 5–15)
BUN: 21 mg/dL — ABNORMAL HIGH (ref 6–20)
BUN: 18 mg/dL (ref 6–20)
BUN: 20 mg/dL (ref 6–20)
CO2: 22 mmol/L (ref 22–32)
CO2: 22 mmol/L (ref 22–32)
CO2: 21 mmol/L — ABNORMAL LOW (ref 22–32)
Calcium: 9.5 mg/dL (ref 8.9–10.3)
Calcium: 9.1 mg/dL (ref 8.9–10.3)
Calcium: 9.1 mg/dL (ref 8.9–10.3)
Chloride: 107 mmol/L (ref 98–111)
Chloride: 106 mmol/L (ref 98–111)
Chloride: 105 mmol/L (ref 98–111)
Creatinine, Ser: 1.35 mg/dL — ABNORMAL HIGH (ref 0.61–1.24)
Creatinine, Ser: 1.36 mg/dL — ABNORMAL HIGH (ref 0.61–1.24)
Creatinine, Ser: 1.32 mg/dL — ABNORMAL HIGH (ref 0.61–1.24)
GFR, Estimated: >60 mL/min (ref >60)
GFR, Estimated: >60 mL/min (ref >60)
GFR, Estimated: >60 mL/min (ref >60)
Glucose, Bld: 220 mg/dL — ABNORMAL HIGH (ref 70–99)
Glucose, Bld: 326 mg/dL — ABNORMAL HIGH (ref 70–99)
Glucose, Bld: 319 mg/dL — ABNORMAL HIGH (ref 70–99)
Phosphorus: 2.1 mg/dL — ABNORMAL LOW (ref 2.5–4.6)
Phosphorus: 1.8 mg/dL — ABNORMAL LOW (ref 2.5–4.6)
Phosphorus: 1.8 mg/dL — ABNORMAL LOW (ref 2.5–4.6)
Potassium: 3.4 mmol/L — ABNORMAL LOW (ref 3.5–5.1)
Potassium: 3.2 mmol/L — ABNORMAL LOW (ref 3.5–5.1)
Potassium: 3.4 mmol/L — ABNORMAL LOW (ref 3.5–5.1)
Sodium: 140 mmol/L (ref 135–145)
Sodium: 137 mmol/L (ref 135–145)
Sodium: 137 mmol/L (ref 135–145)

## 2021-02-14 LAB — GLUCOSE, CAPILLARY
Glucose-Capillary: 193 mg/dL — ABNORMAL HIGH (ref 70–99)
Glucose-Capillary: 224 mg/dL — ABNORMAL HIGH (ref 70–99)
Glucose-Capillary: 236 mg/dL — ABNORMAL HIGH (ref 70–99)
Glucose-Capillary: 217 mg/dL — ABNORMAL HIGH (ref 70–99)
Glucose-Capillary: 131 mg/dL — ABNORMAL HIGH (ref 70–99)
Glucose-Capillary: 265 mg/dL — ABNORMAL HIGH (ref 70–99)
Glucose-Capillary: 348 mg/dL — ABNORMAL HIGH (ref 70–99)
Glucose-Capillary: 302 mg/dL — ABNORMAL HIGH (ref 70–99)
Glucose-Capillary: 375 mg/dL — ABNORMAL HIGH (ref 70–99)

## 2021-02-14 LAB — BASIC METABOLIC PANEL
Anion gap: 14 (ref 5–15)
Anion gap: 15 (ref 5–15)
Anion gap: 13 (ref 5–15)
BUN: 21 mg/dL — ABNORMAL HIGH (ref 6–20)
BUN: 22 mg/dL — ABNORMAL HIGH (ref 6–20)
BUN: 23 mg/dL — ABNORMAL HIGH (ref 6–20)
CO2: 20 mmol/L — ABNORMAL LOW (ref 22–32)
CO2: 20 mmol/L — ABNORMAL LOW (ref 22–32)
CO2: 19 mmol/L — ABNORMAL LOW (ref 22–32)
Calcium: 10 mg/dL (ref 8.9–10.3)
Calcium: 10.1 mg/dL (ref 8.9–10.3)
Calcium: 9.9 mg/dL (ref 8.9–10.3)
Chloride: 105 mmol/L (ref 98–111)
Chloride: 105 mmol/L (ref 98–111)
Chloride: 104 mmol/L (ref 98–111)
Creatinine, Ser: 1.8 mg/dL — ABNORMAL HIGH (ref 0.61–1.24)
Creatinine, Ser: 1.83 mg/dL — ABNORMAL HIGH (ref 0.61–1.24)
Creatinine, Ser: 1.57 mg/dL — ABNORMAL HIGH (ref 0.61–1.24)
GFR, Estimated: 52 mL/min — ABNORMAL LOW (ref >60)
GFR, Estimated: 53 mL/min — ABNORMAL LOW (ref >60)
GFR, Estimated: >60 mL/min (ref >60)
Glucose, Bld: 238 mg/dL — ABNORMAL HIGH (ref 70–99)
Glucose, Bld: 238 mg/dL — ABNORMAL HIGH (ref 70–99)
Glucose, Bld: 208 mg/dL — ABNORMAL HIGH (ref 70–99)
Potassium: 4.3 mmol/L (ref 3.5–5.1)
Potassium: 5.3 mmol/L — ABNORMAL HIGH (ref 3.5–5.1)
Potassium: 5.2 mmol/L — ABNORMAL HIGH (ref 3.5–5.1)
Sodium: 139 mmol/L (ref 135–145)
Sodium: 140 mmol/L (ref 135–145)
Sodium: 136 mmol/L (ref 135–145)

## 2021-02-14 LAB — CBG MONITORING, ED
Glucose-Capillary: 182 mg/dL — ABNORMAL HIGH (ref 70–99)
Glucose-Capillary: 193 mg/dL — ABNORMAL HIGH (ref 70–99)
Glucose-Capillary: 206 mg/dL — ABNORMAL HIGH (ref 70–99)
Glucose-Capillary: 213 mg/dL — ABNORMAL HIGH (ref 70–99)
Glucose-Capillary: 223 mg/dL — ABNORMAL HIGH (ref 70–99)
Glucose-Capillary: 227 mg/dL — ABNORMAL HIGH (ref 70–99)
Glucose-Capillary: 256 mg/dL — ABNORMAL HIGH (ref 70–99)
Glucose-Capillary: 257 mg/dL — ABNORMAL HIGH (ref 70–99)

## 2021-02-14 LAB — BETA-HYDROXYBUTYRIC ACID
Beta-Hydroxybutyric Acid: 5.46 mmol/L — ABNORMAL HIGH (ref 0.05–0.27)
Beta-Hydroxybutyric Acid: 1.74 mmol/L — ABNORMAL HIGH (ref 0.05–0.27)

## 2021-02-14 LAB — PHOSPHORUS
Phosphorus: 3 mg/dL (ref 2.5–4.6)
Phosphorus: 3.3 mg/dL (ref 2.5–4.6)

## 2021-02-14 LAB — MAGNESIUM
Magnesium: 2.6 mg/dL — ABNORMAL HIGH (ref 1.7–2.4)
Magnesium: 2.4 mg/dL (ref 1.7–2.4)
Magnesium: 1.8 mg/dL (ref 1.7–2.4)

## 2021-02-14 MED ORDER — INSULIN ASPART 100 UNIT/ML IJ SOLN: 2.0000 [IU] | Freq: Three times a day (TID) | INTRAMUSCULAR | Status: DC

## 2021-02-14 MED ORDER — INSULIN GLARGINE-YFGN 100 UNIT/ML ~~LOC~~ SOLN
15.0000 [IU] | SUBCUTANEOUS | Status: DC
  Administered 2021-02-14: 15 [IU] via SUBCUTANEOUS
  Filled 2021-02-14 (×2): qty 0.15

## 2021-02-14 MED ORDER — POTASSIUM PHOSPHATES 15 MMOLE/5ML IV SOLN
30.0000 mmol | Freq: Once | INTRAVENOUS | Status: AC
  Administered 2021-02-14: 30 mmol via INTRAVENOUS
  Filled 2021-02-14: qty 10

## 2021-02-14 MED ORDER — INSULIN ASPART 100 UNIT/ML IJ SOLN
6.0000 [IU] | Freq: Three times a day (TID) | INTRAMUSCULAR | Status: DC
  Administered 2021-02-14 – 2021-02-15 (×3): 6 [IU] via SUBCUTANEOUS

## 2021-02-14 MED ORDER — POTASSIUM CHLORIDE CRYS ER 20 MEQ PO TBCR
40.0000 meq | EXTENDED_RELEASE_TABLET | ORAL | Status: AC
Start: 2021-02-14 — End: 2021-02-14
  Administered 2021-02-14: 40 meq via ORAL
  Filled 2021-02-14: qty 2

## 2021-02-14 MED ORDER — INSULIN ASPART 100 UNIT/ML IJ SOLN
0.0000 [IU] | Freq: Every day | INTRAMUSCULAR | Status: DC
  Administered 2021-02-14: 5 [IU] via SUBCUTANEOUS

## 2021-02-14 MED ORDER — INSULIN ASPART 100 UNIT/ML IJ SOLN
0.0000 [IU] | Freq: Three times a day (TID) | INTRAMUSCULAR | Status: DC
Start: 2021-02-14 — End: 2021-02-16
  Administered 2021-02-14: 11 [IU] via SUBCUTANEOUS
  Administered 2021-02-15: 5 [IU] via SUBCUTANEOUS
  Administered 2021-02-15: 11 [IU] via SUBCUTANEOUS

## 2021-02-14 MED ORDER — LACTATED RINGERS IV SOLN: INTRAVENOUS | Status: DC

## 2021-02-14 NOTE — Progress Notes (Signed)
1800: Patient has eaten and received his long acting insulin dose about 2 hours ago. Insulin pump stopped, Blood sugar checked and it's 302, 17units of short acting insulin given per order.

## 2021-02-14 NOTE — Progress Notes (Signed)
Inpatient Diabetes Program Recommendations  AACE/ADA: New Consensus Statement on Inpatient Glycemic Control   Target Ranges:  Prepandial:   less than 140 mg/dL      Peak postprandial:   less than 180 mg/dL (1-2 hours)      Critically ill patients:  140 - 180 mg/dL   Results for Mark Contreras, Mark Contreras (MRN [id number]) as of 02/14/2021 10:36  Ref. Range 02/14/2021 00:02 02/14/2021 01:20 02/14/2021 02:16 02/14/2021 03:54 02/14/2021 04:55 02/14/2021 05:54 02/14/2021 07:17 02/14/2021 08:19 02/14/2021 09:41  Glucose-Capillary Latest Ref Range: 70 - 99 mg/dL 160 (H) 109 (H) 323 (H) 193 (H) 182 (H) 213 (H) 223 (H) 227 (H) 193 (H)   Results for Mark Contreras, Mark Contreras (MRN [id number]) as of 02/14/2021 10:36  Ref. Range 02/13/2021 11:11  CO2 Latest Ref Range: 22 - 32 mmol/L 12 (L)  Glucose Latest Ref Range: 70 - 99 mg/dL 427 (HH)  Anion gap Latest Ref Range: 5 - 15  28 (H)  Results for Mark Contreras, Mark Contreras (MRN [id number]) as of 02/14/2021 10:36  Ref. Range 02/13/2021 11:11 02/13/2021 19:07 02/14/2021 01:28  Beta-Hydroxybutyric Acid Latest Ref Range: 0.05 - 0.27 mmol/L >8.00 (H) >8.00 (H) 5.46 (H)   Review of Glycemic Control  Diabetes history: DM2 Outpatient Diabetes medications: None; had been on Metformin XR, Lantus, and Novolog in the past (per chart review) Current orders for Inpatient glycemic control: IV insulin per DKA  Inpatient Diabetes Program Recommendations:    Insulin: IV insulin should be continued until acidosis has been completely resolved. Once acidosis is resolved and provider is ready to transition from IV to SQ insulin, please consider ordering Semglee 25 units (based on 95.3 kg x 0.25 units), CBGs Q4H, Novolog 0-15 units Q4H.  NOTE: Noted consult for Diabetes Coordinator. Diabetes Coordinator is not on campus over the weekend but available by pager from 8am to 5pm for questions or concerns. Chart reviewed. Patient admitted with DKA with initial glucose of 666 mg/dl on 15/17/61 and patient was  started on IV insulin drip. Noted initial consult note on 01/19/2017 by Dr. Leslie Dales (Endocrinologist) which notes "Diabetes was diagnosed on 10/19/16 when he presented to urgent care with 1 week history of "loss of energy, exhausted, increased thirst and frequent urination every 20 minutes." He had been drinking moderately high amounts of apple juice and ginger ale and eating grapes. Weight was down 25# (to 198#). Glucose was 1222. He was hospitalized in [place], [place] for 3 days, diagnosed as type 1 diabetes, and discharged with sugars about 250 on metformin and insulin. He saw Samuel Bouche, NP [facility] [place] [place]) on 10/26/16, at which time lab glucose was 107 and HbA1c was 11.6%. Metformin and insulin were continued. Likely a variant type 2 (rather than type 1) diabetes, with severe hyperglycemia at diagnosis fueled by short-term intake of large quantities of sugar sweetened beverages; then rapid glycemic improvement and stabilization after diagnosis in 7/18; no significant change in glycemic control by his report off metformin for 2 weeks and Novolog for 10 days; C-Peptide 2.39 and GAD-65 autoantibody negative in 9/18. Patient was advised to Resume metformin, as metformin ER (extended release) 500 mg 2 tablets once a day. Stop Lantus. Remain off Novolog."  Diabetes coordinator will plan to follow up with patient on Monday 02/15/21.  Thanks, Orlando Penner, RN, MSN, CDE Diabetes Coordinator Inpatient Diabetes Program [phone] (Team Pager from 8am to 5pm)

## 2021-02-14 NOTE — Progress Notes (Signed)
PROGRESS NOTE  Mark Contreras KDX:[id number] DOB: 06/06/1995   PCP: Pcp, No  Patient is from: Home.  DOA: 02/13/2021 LOS: 0  Chief complaints:  Chief Complaint  Patient presents with   Hyperglycemia     Brief Narrative / Interim history: 25 y.o. male with history of IDDM, HTN, asthma, marijuana use and alcohol use disorder presenting with frequent urination for 3 days and generalized weakness for about 2 weeks, and admitted with DKA, hyperkalemia, AKI, uncontrolled hypertension and tachycardia.  Reportedly has been diabetic meds for 3 years, not on antihypertensive meds or inhalers although he was supposed to be on metformin and lisinopril prior his chart from care everywhere in March 2022.  Subjective: Seen and examined earlier this morning.  No major events overnight of this morning.  He reports dry mouth, frequency and some dysuria.  He attributed the dysuria to urinating frequently.  No fever, chest pain, dyspnea or GI symptoms.  Objective: Vitals:   02/14/21 0745 02/14/21 0830 02/14/21 0900 02/14/21 0947  BP: 133/77 (!) 144/98 (!) 139/98 (!) 143/90  Pulse: 89 97 81   Resp: 14 12 10    Temp:      TempSrc:      SpO2: 95% 96% 95% 96%  Weight:      Height:        Intake/Output Summary (Last 24 hours) at 02/14/2021 1025 Last data filed at 02/14/2021 0347 Gross per 24 hour  Intake 214.43 ml  Output 500 ml  Net -285.57 ml   Filed Weights   02/13/21 1312  Weight: 95.3 kg    Examination:  GENERAL: No apparent distress.  Nontoxic. HEENT: MMM.  Vision and hearing grossly intact.  NECK: Supple.  No apparent JVD.  RESP:  No IWOB.  Fair aeration bilaterally. CVS:  RRR. Heart sounds normal.  ABD/GI/GU: BS+. Abd soft, NTND.  MSK/EXT:  Moves extremities. No apparent deformity. No edema.  SKIN: no apparent skin lesion or wound NEURO: Awake, alert and oriented appropriately.  No apparent focal neuro deficit. PSYCH: Calm. Normal affect.   Procedures:  None  Microbiology  summarized: COVID-19 and influenza PCR nonreactive.  Assessment & Plan:= Uncontrolled DM-2 with ketoacidosis/hyperglycemia: Likely due to noncompliance and alcohol abuse.  Glucose 666.  pH 7.2.  Bicarb 12>> 19.  AG 28>>> 13.  BHA > 8.0>> 5.46.  K7.4>> 6.1> 5.2.  No mental status change.  No signs of infection or ACS.  Dysuria and frequent urination likely from hyperglycemia.  A1c 10.9% (from 6.1 in 05/2020). Still with acidosis and hyperkalemia. -Continue DKA pathway with IV fluid, insulin drip and frequent labs, and transition when appropriate -Increase IV LR to 150 cc an hour -Diabetic coordinator consulted. -N.p.o. except sips and ice chips   AKI/azotemia-likely prerenal in the setting of DKA.  Improved. Recent Labs    02/13/21 1111 02/13/21 1909 02/14/21 0128 02/14/21 0742  BUN 28* 24* 21*  22* 23*  CREATININE 2.19* 2.04* 1.80*  1.83* 1.57*  -IV fluid as above -Continue monitoring   Hyperkalemia: 7.4 but hemolyzed> 6.1> 5.2.  Likely intracellular shift from acidosis Hypercalcemia: 10.6>> 9.9.  Likely due to dehydration in the setting of DKA. Hyperphosphatemia: P 5.0>> 3.3.  Resolved. -Continue IV fluid -Check with every 4 BMP   Sinus tachycardia/uncontrolled hypertension: Likely due to DKA.  Tachycardia resolved.  BP improved. -Continue labetalol as needed -Continue holding home lisinopril in the setting of AKI   Alcohol abuse: Admits to drinking tequila.  Had 3 shots yesterday.  No withdrawal symptoms. -Counseled  and TOC consulted. -Continue CIWA with as needed Ativan -Multivitamin/thiamine and folic acid   Mild intermittent asthma: Not on medication. -As needed albuterol   Hyponatremia: Likely due to hyperglycemia.  Resolved.  Marijuana use:  -Encourage cessation.  Class I obesity Body mass index is 32.89 kg/m.  -Encourage lifestyle change to lose weight.       DVT prophylaxis:  On subcu Lovenox  Code Status: Full code Family Communication: Patient  and/or RN. Available if any question.  Level of care: Progressive Status is: Observation  The patient will require care spanning > 2 midnights and should be moved to inpatient because: Diabetic ketoacidosis requiring IV fluid and IV insulin, electrolytes abnormalities including hyperkalemia and AKI      Consultants:  Diabetic coordinator  Sch Meds:  Scheduled Meds:  enoxaparin (LOVENOX) injection  50 mg Subcutaneous Q24H   folic acid  1 mg Oral Daily   multivitamin with minerals  1 tablet Oral Daily   thiamine  100 mg Oral Daily   Or   thiamine  100 mg Intravenous Daily   Continuous Infusions:  dextrose 5% lactated ringers 125 mL/hr at 02/14/21 0553   insulin 3.2 Units/hr (02/14/21 0956)   lactated ringers Stopped (02/14/21 0310)   PRN Meds:.albuterol, dextrose, labetalol, LORazepam **OR** LORazepam  Antimicrobials: Anti-infectives (From admission, onward)    None        I have personally reviewed the following labs and images: CBC: Recent Labs  Lab 02/13/21 1111 02/13/21 1432  WBC 9.0  --   HGB 16.0 17.3*  HCT 50.0 51.0  MCV 81.7  --   PLT 217  --    BMP &GFR Recent Labs  Lab 02/13/21 1111 02/13/21 1432 02/13/21 1909 02/14/21 0128 02/14/21 0742  NA 134* 135 140 139  140 136  K 7.4* 6.1* 4.0 5.3*  4.3 5.2*  CL 94*  --  104 105  105 104  CO2 12*  --  12* 20*  20* 19*  GLUCOSE 666*  --  250* 238*  238* 208*  BUN 28*  --  24* 21*  22* 23*  CREATININE 2.19*  --  2.04* 1.80*  1.83* 1.57*  CALCIUM 10.6*  --  10.4* 10.0  10.1 9.9  MG  --   --  2.9* 2.6* 2.4  PHOS  --   --  5.0* 3.0 3.3   Estimated Creatinine Clearance: 78.5 mL/min (A) (by C-G formula based on SCr of 1.57 mg/dL (H)). Liver & Pancreas: No results for input(s): AST, ALT, ALKPHOS, BILITOT, PROT, ALBUMIN in the last 168 hours. No results for input(s): LIPASE, AMYLASE in the last 168 hours. No results for input(s): AMMONIA in the last 168 hours. Diabetic: Recent Labs     02/13/21 1815  HGBA1C 10.9*   Recent Labs  Lab 02/14/21 0455 02/14/21 0554 02/14/21 0717 02/14/21 0819 02/14/21 0941  GLUCAP 182* 213* 223* 227* 193*   Cardiac Enzymes: No results for input(s): CKTOTAL, CKMB, CKMBINDEX, TROPONINI in the last 168 hours. No results for input(s): PROBNP in the last 8760 hours. Coagulation Profile: No results for input(s): INR, PROTIME in the last 168 hours. Thyroid Function Tests: No results for input(s): TSH, T4TOTAL, FREET4, T3FREE, THYROIDAB in the last 72 hours. Lipid Profile: No results for input(s): CHOL, HDL, LDLCALC, TRIG, CHOLHDL, LDLDIRECT in the last 72 hours. Anemia Panel: No results for input(s): VITAMINB12, FOLATE, FERRITIN, TIBC, IRON, RETICCTPCT in the last 72 hours. Urine analysis:    Component Value Date/Time   COLORURINE  COLORLESS (A) 02/13/2021 1106   APPEARANCEUR CLEAR 02/13/2021 1106   LABSPEC 1.027 02/13/2021 1106   PHURINE 5.0 02/13/2021 1106   GLUCOSEU >=500 (A) 02/13/2021 1106   HGBUR SMALL (A) 02/13/2021 1106   BILIRUBINUR NEGATIVE 02/13/2021 1106   KETONESUR 80 (A) 02/13/2021 1106   PROTEINUR 100 (A) 02/13/2021 1106   NITRITE NEGATIVE 02/13/2021 1106   LEUKOCYTESUR NEGATIVE 02/13/2021 1106   Sepsis Labs: Invalid input(s): PROCALCITONIN, LACTICIDVEN  Microbiology: Recent Results (from the past 240 hour(s))  Resp Panel by RT-PCR (Flu A&B, Covid) Nasopharyngeal Swab     Status: None   Collection Time: 02/13/21  7:09 PM   Specimen: Nasopharyngeal Swab; Nasopharyngeal(NP) swabs in vial transport medium  Result Value Ref Range Status   SARS Coronavirus 2 by RT PCR NEGATIVE NEGATIVE Final    Comment: (NOTE) SARS-CoV-2 target nucleic acids are NOT DETECTED.  The SARS-CoV-2 RNA is generally detectable in upper respiratory specimens during the acute phase of infection. The lowest concentration of SARS-CoV-2 viral copies this assay can detect is 138 copies/mL. A negative result does not preclude  SARS-Cov-2 infection and should not be used as the sole basis for treatment or other patient management decisions. A negative result may occur with  improper specimen collection/handling, submission of specimen other than nasopharyngeal swab, presence of viral mutation(s) within the areas targeted by this assay, and inadequate number of viral copies(<138 copies/mL). A negative result must be combined with clinical observations, patient history, and epidemiological information. The expected result is Negative.  Fact Sheet for Patients:  BloggerCourse.com  Fact Sheet for Healthcare Providers:  SeriousBroker.it  This test is no t yet approved or cleared by the [place] FDA and  has been authorized for detection and/or diagnosis of SARS-CoV-2 by FDA under an Emergency Use Authorization (EUA). This EUA will remain  in effect (meaning this test can be used) for the duration of the COVID-19 declaration under Section 564(b)(1) of the Act, 21 U.S.C.section 360bbb-3(b)(1), unless the authorization is terminated  or revoked sooner.       Influenza A by PCR NEGATIVE NEGATIVE Final   Influenza B by PCR NEGATIVE NEGATIVE Final    Comment: (NOTE) The Xpert Xpress SARS-CoV-2/FLU/RSV plus assay is intended as an aid in the diagnosis of influenza from Nasopharyngeal swab specimens and should not be used as a sole basis for treatment. Nasal washings and aspirates are unacceptable for Xpert Xpress SARS-CoV-2/FLU/RSV testing.  Fact Sheet for Patients: BloggerCourse.com  Fact Sheet for Healthcare Providers: SeriousBroker.it  This test is not yet approved or cleared by the [place] FDA and has been authorized for detection and/or diagnosis of SARS-CoV-2 by FDA under an Emergency Use Authorization (EUA). This EUA will remain in effect (meaning this test can be used) for the duration of  the COVID-19 declaration under Section 564(b)(1) of the Act, 21 U.S.C. section 360bbb-3(b)(1), unless the authorization is terminated or revoked.  Performed at [facility], [address] [place], [place] [address]     Radiology Studies: No results found.    Albena Comes T. Aleesha Ringstad [facility]  If 7PM-7AM, please contact night-coverage [email or web address] 02/14/2021, 10:25 AM

## 2021-02-14 NOTE — Plan of Care (Signed)

## 2021-02-15 DIAGNOSIS — F1210 Cannabis abuse, uncomplicated: Secondary | ICD-10-CM | POA: Diagnosis not present

## 2021-02-15 DIAGNOSIS — E785 Hyperlipidemia, unspecified: Secondary | ICD-10-CM

## 2021-02-15 DIAGNOSIS — E876 Hypokalemia: Secondary | ICD-10-CM

## 2021-02-15 DIAGNOSIS — E1110 Type 2 diabetes mellitus with ketoacidosis without coma: Secondary | ICD-10-CM | POA: Diagnosis not present

## 2021-02-15 DIAGNOSIS — E1169 Type 2 diabetes mellitus with other specified complication: Secondary | ICD-10-CM

## 2021-02-15 DIAGNOSIS — N179 Acute kidney failure, unspecified: Secondary | ICD-10-CM | POA: Diagnosis not present

## 2021-02-15 DIAGNOSIS — E871 Hypo-osmolality and hyponatremia: Secondary | ICD-10-CM

## 2021-02-15 DIAGNOSIS — F1010 Alcohol abuse, uncomplicated: Secondary | ICD-10-CM | POA: Diagnosis not present

## 2021-02-15 LAB — LIPID PANEL
Cholesterol: 241 mg/dL — ABNORMAL HIGH (ref 0–200)
HDL: 44 mg/dL (ref >40)
LDL Cholesterol: 148 mg/dL — ABNORMAL HIGH (ref 0–99)
Total CHOL/HDL Ratio: 5.5 RATIO
Triglycerides: 244 mg/dL — ABNORMAL HIGH (ref <150)
VLDL: 49 mg/dL — ABNORMAL HIGH (ref 0–40)

## 2021-02-15 LAB — RENAL FUNCTION PANEL
Albumin: 2.9 g/dL — ABNORMAL LOW (ref 3.5–5.0)
Anion gap: 8 (ref 5–15)
BUN: 15 mg/dL (ref 6–20)
CO2: 24 mmol/L (ref 22–32)
Calcium: 8.4 mg/dL — ABNORMAL LOW (ref 8.9–10.3)
Chloride: 104 mmol/L (ref 98–111)
Creatinine, Ser: 1.21 mg/dL (ref 0.61–1.24)
GFR, Estimated: >60 mL/min (ref >60)
Glucose, Bld: 334 mg/dL — ABNORMAL HIGH (ref 70–99)
Phosphorus: 3.3 mg/dL (ref 2.5–4.6)
Potassium: 3.5 mmol/L (ref 3.5–5.1)
Sodium: 136 mmol/L (ref 135–145)

## 2021-02-15 LAB — MAGNESIUM: Magnesium: 1.8 mg/dL (ref 1.7–2.4)

## 2021-02-15 LAB — GLUCOSE, CAPILLARY
Glucose-Capillary: 308 mg/dL — ABNORMAL HIGH (ref 70–99)
Glucose-Capillary: 206 mg/dL — ABNORMAL HIGH (ref 70–99)

## 2021-02-15 MED ORDER — POLYETHYLENE GLYCOL 3350 17 G PO PACK: 17.0000 g | PACK | Freq: Two times a day (BID) | ORAL | Status: DC | PRN

## 2021-02-15 MED ORDER — LISINOPRIL 20 MG PO TABS: 20.0000 mg | ORAL_TABLET | Freq: Every day | ORAL | 3 refills | Status: AC

## 2021-02-15 MED ORDER — PEN NEEDLES 30G X 5 MM MISC: 1.0000 "pen " | Freq: Two times a day (BID) | 1 refills | Status: AC

## 2021-02-15 MED ORDER — POTASSIUM CHLORIDE CRYS ER 20 MEQ PO TBCR
40.0000 meq | EXTENDED_RELEASE_TABLET | Freq: Once | ORAL | Status: AC
  Administered 2021-02-15: 40 meq via ORAL
  Filled 2021-02-15: qty 2

## 2021-02-15 MED ORDER — ATORVASTATIN CALCIUM 20 MG PO TABS: 20.0000 mg | ORAL_TABLET | Freq: Every day | ORAL | 1 refills | Status: AC

## 2021-02-15 MED ORDER — ALBUTEROL SULFATE HFA 108 (90 BASE) MCG/ACT IN AERS: 1.0000 | INHALATION_SPRAY | Freq: Four times a day (QID) | RESPIRATORY_TRACT | 0 refills | Status: AC | PRN

## 2021-02-15 MED ORDER — INSULIN GLARGINE-YFGN 100 UNIT/ML ~~LOC~~ SOLN
25.0000 [IU] | SUBCUTANEOUS | Status: DC
  Administered 2021-02-15: 25 [IU] via SUBCUTANEOUS
  Filled 2021-02-15: qty 0.25

## 2021-02-15 MED ORDER — INSULIN LISPRO (1 UNIT DIAL) 100 UNIT/ML (KWIKPEN): 5.0000 [IU] | PEN_INJECTOR | Freq: Three times a day (TID) | SUBCUTANEOUS | 11 refills | Status: AC

## 2021-02-15 MED ORDER — METFORMIN HCL 1000 MG PO TABS: 1000.0000 mg | ORAL_TABLET | Freq: Every day | ORAL | 3 refills | Status: AC

## 2021-02-15 MED ORDER — SENNOSIDES-DOCUSATE SODIUM 8.6-50 MG PO TABS: 1.0000 | ORAL_TABLET | Freq: Two times a day (BID) | ORAL | Status: DC | PRN

## 2021-02-15 MED ORDER — BLOOD GLUCOSE MONITOR KIT: PACK | 0 refills | Status: AC

## 2021-02-15 MED ORDER — INSULIN GLARGINE-YFGN 100 UNIT/ML ~~LOC~~ SOPN: 25.0000 [IU] | PEN_INJECTOR | Freq: Every day | SUBCUTANEOUS | 1 refills | Status: AC

## 2021-02-15 NOTE — Discharge Summary (Signed)
Physician Discharge Summary  Mark Contreras WUJ:[id number] DOB: October 15, 1995 DOA: 02/13/2021  PCP: No primary care provider on file.  Admit date: 02/13/2021 Discharge date: 02/15/2021 Admitted From: Home Disposition: Home Recommendations for Outpatient Follow-up:  Follow ups as below. Please obtain CBC/BMP/Mag at follow up Please follow up on the following pending results: None Home Health: Not indicated Equipment/Devices: Not indicated Discharge Condition: Stable CODE STATUS: Full code  Follow-up Information     Octavia Bruckner. Schedule an appointment as soon as possible for a visit in 1 week(s).   Contact information: Address: [address] [place], [place] [address] Phone: [phone]        Dr. Octavia Bruckner. Schedule an appointment as soon as possible for a visit in 1 week(s).   Why: Hospital follow up Contact information: [phone]               Hospital Course: 25 y.o. male with history of IDDM, HTN, asthma, marijuana use and alcohol use disorder presenting with frequent urination for 3 days and generalized weakness for about 2 weeks, and admitted with DKA, hyperkalemia, AKI, uncontrolled hypertension and tachycardia.  He reports coming off diabetic medications about 3 years ago.  However, it appears he was supposed to be on metformin and lisinopril as of March 2022.  He also admits to dietary indiscretion.   Patient was started on IV fluid and insulin drip per DKA protocol.  Eventually, DKA resolved.  He was transitioned to subcu insulin.  His A1c was 10.9%, from 6.1% about 8 months prior.  His AKI and electrolyte treatments resolved as well.  Patient is discharged on Lantus 25 units daily, Humalog 5 units 3 times daily with meals and metformin 1000 mg twice daily.  He was given prescriptions for diabetic supplies as well.  His LDL was 148.  He was also started on atorvastatin 20 mg daily.  Patient to resume lisinopril in 3 to 4 days. He was counseled on the  importance of compliance with medication and lifestyle change.  He was counseled by diabetic coordinator.  He was given written instruction on management of his diabetes and hypoglycemia.   Patient to follow-up with his primary care doctor in 1 week.  See individual problem list below for more on hospital course.  Discharge Diagnoses:  Uncontrolled DM-2 with ketoacidosis/hyperglycemia: Likely due to noncompliance and alcohol abuse.  Glucose 666.  pH 7.2.  Bicarb 12>> 19.  AG 28>>> 13.  BHA > 8.0>> 5.46.  K7.4>> 6.1> 5.2.  No mental status change.  No signs of infection or ACS.  Dysuria and frequent urination likely from hyperglycemia, and resolved..  A1c 10.9% (from 6.1 in 05/2020).  DKA resolved. -See discharge plan and instruction below.    AKI/azotemia-likely prerenal in the setting of DKA.  Resolved. Recent Labs    02/13/21 1111 02/13/21 1909 02/14/21 0128 02/14/21 0742 02/14/21 1203 02/14/21 1607 02/14/21 1928 02/15/21 0743  BUN 28* 24* 21*  22* 23* 21* 18 20 15   CREATININE 2.19* 2.04* 1.80*  1.83* 1.57* 1.35* 1.36* 1.32* 1.21  -Recheck renal function at follow-up   Hyponatremia/hyperkalemia/hypokalemia/hypomagnesemia/hypophosphatemia/hypophosphatemia/hypocalcemia: Likely due to DKA and dehydration.  Resolved.     Sinus tachycardia/uncontrolled hypertension: Likely due to DKA.  Tachycardia resolved.  BP improved. -Patient to start lisinopril 20 mg daily in 3 to 4 days.   Alcohol abuse: Admits to drinking tequila.  Had 3 shots yesterday.  No withdrawal symptoms. -Counseled on cessation and moderation.   Mild intermittent asthma: Not on  medication. -Renewed his prescription for as needed albuterol   Marijuana use:  -Encouraged cessation.   Class I obesity Body mass index is 32.89 kg/m. -Counseled on lifestyle change to lose weight           Discharge Exam: Vitals:   02/15/21 0425 02/15/21 0749 02/15/21 0800 02/15/21 1301  BP: (!) 153/96 138/90 131/81 (!)  155/90  Pulse: 76 82 88 89  Temp: 98.1 F (36.7 C) Comment: not sure where the thermometer went unless I am blind. Will try to find an extra one    Resp: 18 17    Height:      Weight:      SpO2: 96% 99%  97%  TempSrc: Oral     BMI (Calculated):         GENERAL: No apparent distress.  Nontoxic. HEENT: MMM.  Vision and hearing grossly intact.  NECK: Supple.  No apparent JVD.  RESP: On RA.  No IWOB.  Fair aeration bilaterally. CVS:  RRR. Heart sounds normal.  ABD/GI/GU: Bowel sounds present. Soft. Non tender.  MSK/EXT:  Moves extremities. No apparent deformity. No edema.  SKIN: no apparent skin lesion or wound NEURO: Awake and alert.  Oriented appropriately.  No apparent focal neuro deficit. PSYCH: Calm. Normal affect.   Discharge Instructions  Discharge Instructions     Call MD for:  extreme fatigue   Complete by: As directed    Call MD for:  persistant dizziness or light-headedness   Complete by: As directed    Call MD for:  persistant nausea and vomiting   Complete by: As directed    Diet - low sodium heart healthy   Complete by: As directed    Diet Carb Modified   Complete by: As directed    Discharge instructions   Complete by: As directed    It has been a pleasure taking care of you!  You were hospitalized with diabetic ketoacidosis from uncontrolled diabetes, acute kidney injury and electrolyte treatment.  We have restarted you on insulin and other medication for your diabetes, cholesterol and blood pressure.  It is very important that you take your medications as prescribed.  Please review your new medication list and the directions on your medications before you take them.  Please follow-up with your primary care doctor and endocrinologist in 1 to 2 weeks.   In addition to taking new medication, we strongly recommend you stop drinking alcohol or cut down.  We also recommend avoiding marijuana.  See below for more on management of your diabetes  Managing your  diabetes:  Your A1c is 10.9%. Your goal A1c is less than 7.0%. See below for more information about A1c.  Check your blood glucose 3-4 times a day 15 minutes before each meal and keep blood glucose log. Inject 25 units of SEMEGLEE (long-acting insulin) every morning.  You may increase by 2 units the next day if your blood glucose is consistently greater than 180.  You may cut down by 2 units if your blood glucose is consistently less than 100. Inject 5 units of Humalog (short acting insulin) about 15 minutes before each meal. Skip if you blood glucose is less than 100.  Make sure you eat your meals after injecting insulin. Take your other diabetic medications as prescribed. Eat regular meals (breakfast, lunch and dinner) around-the-clock. You may snack as needed. See below about few tips and recommendations on diet and exercise.  Watch for symptoms of low blood sugar (hypoglycemia). Read below  about these symptoms and management. Please follow-up with your primary care doctor in 1 to 2 weeks.  Take your medications and blood glucose log to your follow-up. You also need an eye exam as soon as possible.  Please find an eye doctor and schedule an appointment as soon as possible. Do not walk barefoot.  Wear appropriate shoe with good cushion . Check your feet for any skin break or wound daily.  What is A1c:  The A1C test result reflects your average blood sugar level for the past two to three months. Specifically, the A1C test measures what percentage of your hemoglobin - a protein in red blood cells that carries oxygen - is coated with sugar (glycated). The higher your A1C level, the poorer your blood sugar control and the higher your risk of diabetes complications. Portion Size ?  Choose healthier foods such as 100% whole grains, vegetables, fruits, beans, nut seeds, olive oil, most vegetable oils, fat-free dietary, wild game and fish.  Avoid sweet tea, other sweetened beverages, soda, fruit juice,  cold cereal and milk and trans fat.  Eat at least 3 meals and 1-2 snacks per day.  Aim for no more than 5 hours between eating.  Eat breakfast within one hour of getting up.   Exercise at least 150 minutes per week, including weight resistance exercises 3 or 4 times per week.  Try to lose at least 7-10% of your current body weight.   Hypoglycemia ??Hypoglycemia is when the sugar (glucose) level in the blood is too low. Symptoms of low blood sugar may include: Feeling: Hungry. Worried or nervous (anxious). Sweaty and clammy. Confused. Dizzy. Sleepy. Sick to your stomach (nauseous). Having: A fast heartbeat. A headache. A change in your vision. Jerky movements that you cannot control (seizure). Nightmares. Tingling or no feeling (numbness) around the mouth, lips, or tongue. Having trouble with: Talking. Paying attention (concentrating). Moving (coordination). Sleeping. Shaking. Passing out (fainting). Getting upset easily (irritability).  Low blood sugar can happen to people who have diabetes and people who do not have diabetes. Low blood sugar can happen quickly, and it can be an emergency. Treating Low Blood Sugar Low blood sugar is often treated by eating or drinking something sugary right away. If you can think clearly and swallow safely, follow the 15:15 rule: Take 15 grams of a fast-acting carb (carbohydrate). Some fast-acting carbs are: 1 tube of glucose gel. 3 sugar tablets (glucose pills). 6-8 pieces of hard candy. 4 oz (120 mL) of fruit juice. 4 oz (120 mL) of regular (not diet) soda. Check your blood sugar 15 minutes after you take the carb. If your blood sugar is still at or below 70 mg/dL (3.9 mmol/L), take 15 grams of a carb again. If your blood sugar does not go above 70 mg/dL (3.9 mmol/L) after 3 tries, get help right away. After your blood sugar goes back to normal, eat a meal or a snack within 1 hour.   Treating Very Low Blood Sugar If your blood  sugar is at or below 54 mg/dL (3 mmol/L), you have very low blood sugar (severe hypoglycemia). This is an emergency. Do not wait to see if the symptoms will go away. Get medical help right away. Call your local emergency services (911 in the [place] Do not drive yourself to the hospital. If you have very low blood sugar and you cannot eat or drink, you may need a glucagon shot (injection). A family member or friend should learn how to check  your blood sugar and how to give you a glucagon shot. Ask your doctor if you need to have a glucagon shot kit at home. Follow these instructions at home: General instructions Avoid any diets that cause you to not eat enough food. Talk with your doctor before you start any new diet. Take over-the-counter and prescription medicines only as told by your doctor. Limit alcohol to no more than 1 drink per day for nonpregnant women and 2 drinks per day for men. One drink equals 12 oz of beer, 5 oz of wine, or 1 oz of hard liquor. Keep all follow-up visits as told by your doctor. This is important. If You Have Diabetes: ? Make sure you know the symptoms of low blood sugar. Always keep a source of sugar with you, such as: Sugar. Sugar tablets. Glucose gel. Fruit juice. Regular soda (not diet soda). Milk. Hard candy. Honey. Take your medicines as told. Follow your exercise and meal plan. Eat on time. Do not skip meals. Follow your sick day plan when you cannot eat or drink normally. Make this plan ahead of time with your doctor. Check your blood sugar as often as told by your doctor. Always check before and after exercise. Share your diabetes care plan with: Your work or school. People you live with. Check your pee (urine) for ketones: When you are sick. As told by your doctor. Carry a card or wear jewelry that says you have diabetes. If You Have Low Blood Sugar From Other Causes: ? Check your blood sugar as often as told by your doctor. Follow  instructions from your doctor about what you cannot eat or drink. Contact a doctor if: You have trouble keeping your blood sugar in your target range. You have low blood sugar often. Get help right away if: You still have symptoms after you eat or drink something sugary. Your blood sugar is at or below 54 mg/dL (3 mmol/L). You have jerky movements that you cannot control.    Take care,   Increase activity slowly   Complete by: As directed       Allergies as of 02/15/2021       Reactions   Dust Mite Extract Other (See Comments)   Pollen Extract Other (See Comments)        Medication List     STOP taking these medications    cyclobenzaprine 10 MG tablet Commonly known as: FLEXERIL   oxyCODONE-acetaminophen 5-325 MG tablet Commonly known as: PERCOCET/ROXICET       TAKE these medications    albuterol 108 (90 Base) MCG/ACT inhaler Commonly known as: VENTOLIN HFA Inhale 1-2 puffs into the lungs every 6 (six) hours as needed for wheezing or shortness of breath.   atorvastatin 20 MG tablet Commonly known as: Lipitor Take 1 tablet (20 mg total) by mouth daily.   blood glucose meter kit and supplies Kit Dispense based on patient and insurance preference. Use up to four times daily as directed.   insulin glargine-yfgn 100 UNIT/ML Pen Commonly known as: Semglee (yfgn) Inject 25 Units into the skin daily.   insulin lispro 100 UNIT/ML KwikPen Commonly known as: HumaLOG KwikPen Inject 5 Units into the skin 3 (three) times daily.   lisinopril 20 MG tablet Commonly known as: ZESTRIL Take 1 tablet (20 mg total) by mouth daily. Start taking on: February 19, 2021   metFORMIN 1000 MG tablet Commonly known as: Glucophage Take 1 tablet (1,000 mg total) by mouth daily with breakfast.   Pen  Needles 30G X 5 MM Misc 1 pen by Does not apply route in the morning and at bedtime.        Consultations: Diabetic coordinator  Procedures/Studies:   No results  found.     The results of significant diagnostics from this hospitalization (including imaging, microbiology, ancillary and laboratory) are listed below for reference.     Microbiology: Recent Results (from the past 240 hour(s))  Resp Panel by RT-PCR (Flu A&B, Covid) Nasopharyngeal Swab     Status: None   Collection Time: 02/13/21  7:09 PM   Specimen: Nasopharyngeal Swab; Nasopharyngeal(NP) swabs in vial transport medium  Result Value Ref Range Status   SARS Coronavirus 2 by RT PCR NEGATIVE NEGATIVE Final    Comment: (NOTE) SARS-CoV-2 target nucleic acids are NOT DETECTED.  The SARS-CoV-2 RNA is generally detectable in upper respiratory specimens during the acute phase of infection. The lowest concentration of SARS-CoV-2 viral copies this assay can detect is 138 copies/mL. A negative result does not preclude SARS-Cov-2 infection and should not be used as the sole basis for treatment or other patient management decisions. A negative result may occur with  improper specimen collection/handling, submission of specimen other than nasopharyngeal swab, presence of viral mutation(s) within the areas targeted by this assay, and inadequate number of viral copies(<138 copies/mL). A negative result must be combined with clinical observations, patient history, and epidemiological information. The expected result is Negative.  Fact Sheet for Patients:  EntrepreneurPulse.com.au  Fact Sheet for Healthcare Providers:  IncredibleEmployment.be  This test is no t yet approved or cleared by the [place] FDA and  has been authorized for detection and/or diagnosis of SARS-CoV-2 by FDA under an Emergency Use Authorization (EUA). This EUA will remain  in effect (meaning this test can be used) for the duration of the COVID-19 declaration under Section 564(b)(1) of the Act, 21 U.S.C.section 360bbb-3(b)(1), unless the authorization is terminated  or revoked  sooner.       Influenza A by PCR NEGATIVE NEGATIVE Final   Influenza B by PCR NEGATIVE NEGATIVE Final    Comment: (NOTE) The Xpert Xpress SARS-CoV-2/FLU/RSV plus assay is intended as an aid in the diagnosis of influenza from Nasopharyngeal swab specimens and should not be used as a sole basis for treatment. Nasal washings and aspirates are unacceptable for Xpert Xpress SARS-CoV-2/FLU/RSV testing.  Fact Sheet for Patients: EntrepreneurPulse.com.au  Fact Sheet for Healthcare Providers: IncredibleEmployment.be  This test is not yet approved or cleared by the [place] FDA and has been authorized for detection and/or diagnosis of SARS-CoV-2 by FDA under an Emergency Use Authorization (EUA). This EUA will remain in effect (meaning this test can be used) for the duration of the COVID-19 declaration under Section 564(b)(1) of the Act, 21 U.S.C. section 360bbb-3(b)(1), unless the authorization is terminated or revoked.  Performed at [facility], [place] [address], [place],  [address]      Labs:  CBC: Recent Labs  Lab 02/13/21 1111 02/13/21 1432  WBC 9.0  --   HGB 16.0 17.3*  HCT 50.0 51.0  MCV 81.7  --   PLT 217  --    BMP &GFR Recent Labs  Lab 02/13/21 1909 02/14/21 0128 02/14/21 0742 02/14/21 1203 02/14/21 1607 02/14/21 1928 02/15/21 0743  NA 140 139  140 136 140 137 137 136  K 4.0 5.3*  4.3 5.2* 3.4* 3.2* 3.4* 3.5  CL 104 105  105 104 107 106 105 104  CO2 12* 20*  20*  19* 22 22 21* 24  GLUCOSE 250* 238*  238* 208* 220* 326* 319* 334*  BUN 24* 21*  22* 23* 21* 18 20 15   CREATININE 2.04* 1.80*  1.83* 1.57* 1.35* 1.36* 1.32* 1.21  CALCIUM 10.4* 10.0  10.1 9.9 9.5 9.1 9.1 8.4*  MG 2.9* 2.6* 2.4  --  1.8  --  1.8  PHOS 5.0* 3.0 3.3 2.1* 1.8* 1.8* 3.3   Estimated Creatinine Clearance: 101.8 mL/min (by C-G formula based on SCr of 1.21 mg/dL). Liver & Pancreas: Recent Labs  Lab 02/14/21 1203  02/14/21 1607 02/14/21 1928 02/15/21 0743  ALBUMIN 3.7 3.3* 3.4* 2.9*   No results for input(s): LIPASE, AMYLASE in the last 168 hours. No results for input(s): AMMONIA in the last 168 hours. Diabetic: Recent Labs    02/13/21 1815  HGBA1C 10.9*   Recent Labs  Lab 02/14/21 1635 02/14/21 1805 02/14/21 2111 02/15/21 0752 02/15/21 1246  GLUCAP 348* 302* 375* 308* 206*   Cardiac Enzymes: No results for input(s): CKTOTAL, CKMB, CKMBINDEX, TROPONINI in the last 168 hours. No results for input(s): PROBNP in the last 8760 hours. Coagulation Profile: No results for input(s): INR, PROTIME in the last 168 hours. Thyroid Function Tests: No results for input(s): TSH, T4TOTAL, FREET4, T3FREE, THYROIDAB in the last 72 hours. Lipid Profile: Recent Labs    02/15/21 0743  CHOL 241*  HDL 44  LDLCALC 148*  TRIG 244*  CHOLHDL 5.5   Anemia Panel: No results for input(s): VITAMINB12, FOLATE, FERRITIN, TIBC, IRON, RETICCTPCT in the last 72 hours. Urine analysis:    Component Value Date/Time   COLORURINE COLORLESS (A) 02/13/2021 1106   APPEARANCEUR CLEAR 02/13/2021 1106   LABSPEC 1.027 02/13/2021 1106   PHURINE 5.0 02/13/2021 1106   GLUCOSEU >=500 (A) 02/13/2021 1106   HGBUR SMALL (A) 02/13/2021 1106   BILIRUBINUR NEGATIVE 02/13/2021 1106   KETONESUR 80 (A) 02/13/2021 1106   PROTEINUR 100 (A) 02/13/2021 1106   NITRITE NEGATIVE 02/13/2021 1106   LEUKOCYTESUR NEGATIVE 02/13/2021 1106   Sepsis Labs: Invalid input(s): PROCALCITONIN, LACTICIDVEN   Time coordinating discharge: 55 minutes  SIGNED:  Mercy Riding, MD  [facility] 02/15/2021, 2:48 PM

## 2021-02-15 NOTE — TOC Transition Note (Signed)
Transition of Care [facility]) - CM/SW Discharge Note   Patient Details  Name: Mark Contreras MRN: [id number] Date of Birth: Sep 14, 1995  Transition of [facility]) CM/SW Contact:  Harriet Masson, RN Phone Number: 02/15/2021, 2:02 PM   Clinical Narrative:   Patient stable for discharge. Patient states he has insurance and PCP, Dr. Samuel Bouche. Patient states he has his car and plans to drive home.    Final next level of care: Home/Self Care Barriers to Discharge:  (PCP Dr. Samuel Bouche)   Patient Goals and CMS Choice Patient states their goals for this hospitalization and ongoing recovery are:: return home      Discharge Placement             home          Discharge Plan and Services                                     Social Determinants of Health (SDOH) Interventions     Readmission Risk Interventions Readmission Risk Prevention Plan 02/15/2021  Transportation Screening Complete  PCP or Specialist Appt within 5-7 Days Complete  Home Care Screening Patient refused  Medication Review (RN CM) Complete  Some recent data might be hidden

## 2021-02-15 NOTE — Progress Notes (Signed)
Inpatient Diabetes Program Recommendations  AACE/ADA: New Consensus Statement on Inpatient Glycemic Control   Target Ranges:  Prepandial:   less than 140 mg/dL      Peak postprandial:   less than 180 mg/dL (1-2 hours)      Critically ill patients:  140 - 180 mg/dL   Results for Mark Contreras, Mark Contreras (MRN [id number]) as of 02/15/2021 09:27  Ref. Range 02/14/2021 12:31 02/14/2021 13:36 02/14/2021 15:04 02/14/2021 16:35 02/14/2021 18:05 02/14/2021 21:11 02/15/2021 07:52  Glucose-Capillary Latest Ref Range: 70 - 99 mg/dL 217 (H) 131 (H) 265 (H) 348 (H) 302 (H) 375 (H) 308 (H)    Review of Glycemic Control  Diabetes history: DM2 Outpatient Diabetes medications: None; had been on Metformin XR, Lantus, and Novolog in the past (per chart review) Current orders for Inpatient glycemic control: Semglee 25 units Q24H, Novolog 0-15 units TID with meals, Novolog 0-5 units QHS, Novolog 6 units TID with meals  Inpatient Diabetes Program Recommendations:    Insulin: Noted Semglee increased from 15 to 25 units Q24H today.   HbgA1C:  A1C 10.9% on 02/11/21 indicating an average glucose of 266 mg/dl over the past 2-3 months.  NOTE: Will plan to talk with patient today.  Addendum 02/15/21@12:55-Spoke with patient about diabetes and home regimen for diabetes control. Patient confirms that he was dx with DM in 2018 and was originally on Lantus, Novolog, and Metformin then after seeing Dr. Altheimer (Endocrinologist) in 2018, the insulin was stopped and Metformin was continued. Patient states that he has not taken any Metformin in 3 years.  Patient reports that he has insurance (Blue Cross Blue Shield) and works for [employer].  Patient states that he has an appointment with a primary provider next month and he also notes that he goes back and forth from [place] to [place] but plans to stay in [place] the remained of the year.  Patient states that he used insulin pens in the past but does note that he was not fond of sticking himself with  insulin but is agreeable to take insulin if needed.  Discussed A1C results (10.9% on 02/11/21) and explained that current A1C indicates an average glucose of 266 mg/dl over the past 2-3 months. Discussed glucose and A1C goals. Discussed importance of checking CBGs and maintaining good CBG control to prevent long-term and short-term complications.  Stressed to the patient the importance of improving glycemic control to prevent further complications from uncontrolled diabetes. Reviewed hyperglycemia, hypoglycemia, symptoms of both along with treatment for both. Discussed impact of nutrition, exercise, stress, sickness, and medications on diabetes control.  Discussed carbohydrates, carbohydrate goals per day and meal, along with portion sizes. Encouraged patient to check glucose as directed and to keep a log book of glucose readings and DM medication taken which patient will need to take to doctor appointments. Explained how the doctor can use the log book to continue to make adjustments with DM medications if needed. Reviewed insulin pen administration and patient reports that he is knowledgeable and comfortable with using insulin pens for insulin administration.   Patient verbalized understanding of information discussed and reports no further questions at this time related to diabetes. At time of discharge please provide Rx for: glucose monitoring kit (#[id number]) and if prescribed insulin please order insulin pens and insulin pen needles (#[id number]).  Thanks, Marie Byrd, RN, MSN, CDE Diabetes Coordinator Inpatient Diabetes Program [phone] (Team Pager from 8am to 5pm)  

## 2021-02-15 NOTE — Plan of Care (Signed)

## 2021-02-15 NOTE — Plan of Care (Signed)
  Problem: Clinical Measurements: Goal: Respiratory complications will improve Outcome: Progressing Goal: Cardiovascular complication will be avoided Outcome: Progressing   Problem: Activity: Goal: Risk for activity intolerance will decrease Outcome: Progressing   Problem: Health Behavior/Discharge Planning: Goal: Ability to manage health-related needs will improve Outcome: Not Progressing

## 2021-02-16 LAB — GLUCOSE, CAPILLARY: Glucose-Capillary: 361 mg/dL — ABNORMAL HIGH (ref 70–99)

## 2021-02-24 NOTE — Progress Notes (Deleted)
Patient ID: Mark Contreras, male   DOB: Dec 08, 1995, 25 y.o.   MRN: [id number]   After hospitalization 10/29-10/31/2022 Hospital Course: 25 y.o. male with history of IDDM, HTN, asthma, marijuana use and alcohol use disorder presenting with frequent urination for 3 days and generalized weakness for about 2 weeks, and admitted with DKA, hyperkalemia, AKI, uncontrolled hypertension and tachycardia.  He reports coming off diabetic medications about 3 years ago.  However, it appears he was supposed to be on metformin and lisinopril as of March 2022.  He also admits to dietary indiscretion.    Patient was started on IV fluid and insulin drip per DKA protocol.  Eventually, DKA resolved.  He was transitioned to subcu insulin.  His A1c was 10.9%, from 6.1% about 8 months prior.  His AKI and electrolyte treatments resolved as well.  Patient is discharged on Lantus 25 units daily, Humalog 5 units 3 times daily with meals and metformin 1000 mg twice daily.  He was given prescriptions for diabetic supplies as well.  His LDL was 148.  He was also started on atorvastatin 20 mg daily.  Patient to resume lisinopril in 3 to 4 days. He was counseled on the importance of compliance with medication and lifestyle change.  He was counseled by diabetic coordinator.  He was given written instruction on management of his diabetes and hypoglycemia.    Patient to follow-up with his primary care doctor in 1 week.   See individual problem list below for more on hospital course.   Discharge Diagnoses:  Uncontrolled DM-2 with ketoacidosis/hyperglycemia: Likely due to noncompliance and alcohol abuse.  Glucose 666.  pH 7.2.  Bicarb 12>> 19.  AG 28>>> 13.  BHA > 8.0>> 5.46.  K7.4>> 6.1> 5.2.  No mental status change.  No signs of infection or ACS.  Dysuria and frequent urination likely from hyperglycemia, and resolved..  A1c 10.9% (from 6.1 in 05/2020).  DKA resolved. -See discharge plan and instruction below.     AKI/azotemia-likely  prerenal in the setting of DKA.  Resolved. Recent Labs (within last 365 days)            Recent Labs    02/13/21 1111 02/13/21 1909 02/14/21 0128 02/14/21 0742 02/14/21 1203 02/14/21 1607 02/14/21 1928 02/15/21 0743  BUN 28* 24* 21*  22* 23* 21* 18 20 15   CREATININE 2.19* 2.04* 1.80*  1.83* 1.57* 1.35* 1.36* 1.32* 1.21    -Recheck renal function at follow-up   Hyponatremia/hyperkalemia/hypokalemia/hypomagnesemia/hypophosphatemia/hypophosphatemia/hypocalcemia: Likely due to DKA and dehydration.  Resolved.     Sinus tachycardia/uncontrolled hypertension: Likely due to DKA.  Tachycardia resolved.  BP improved. -Patient to start lisinopril 20 mg daily in 3 to 4 days.   Alcohol abuse: Admits to drinking tequila.  Had 3 shots yesterday.  No withdrawal symptoms. -Counseled on cessation and moderation.   Mild intermittent asthma: Not on medication. -Renewed his prescription for as needed albuterol   Marijuana use:  -Encouraged cessation.   Class I obesity Body mass index is 32.89 kg/m. -Counseled on lifestyle change to lose weight

## 2021-02-25 ENCOUNTER — Inpatient Hospital Stay: Payer: Self-pay | Admitting: Physician Assistant

## 2021-02-25 DIAGNOSIS — E1165 Type 2 diabetes mellitus with hyperglycemia: Secondary | ICD-10-CM
